# Patient Record
Sex: Female | Born: 1935
Health system: Southern US, Community
[De-identification: ages and names within clinical notes are randomized; demographics above are authoritative.]

## PROBLEM LIST (undated history)

## (undated) DIAGNOSIS — N950 Postmenopausal bleeding: Secondary | ICD-10-CM

## (undated) DIAGNOSIS — I252 Old myocardial infarction: Secondary | ICD-10-CM

## (undated) DIAGNOSIS — R001 Bradycardia, unspecified: Secondary | ICD-10-CM

## (undated) DIAGNOSIS — I10 Essential (primary) hypertension: Secondary | ICD-10-CM

## (undated) DIAGNOSIS — E785 Hyperlipidemia, unspecified: Secondary | ICD-10-CM

## (undated) DIAGNOSIS — N9489 Other specified conditions associated with female genital organs and menstrual cycle: Secondary | ICD-10-CM

## (undated) DIAGNOSIS — I2510 Atherosclerotic heart disease of native coronary artery without angina pectoris: Secondary | ICD-10-CM

## (undated) HISTORY — DX: Essential (primary) hypertension: I10

## (undated) HISTORY — PX: HYSTEROSCOPY: SHX211

## (undated) HISTORY — PX: APPENDECTOMY: SHX54

## (undated) HISTORY — DX: Bradycardia, unspecified: R00.1

## (undated) HISTORY — DX: Atherosclerotic heart disease of native coronary artery without angina pectoris: I25.10

## (undated) HISTORY — PX: CATARACT EXTRACTION: SUR2

## (undated) HISTORY — PX: DILATION AND CURETTAGE OF UTERUS: SHX78

## (undated) HISTORY — DX: Old myocardial infarction: I25.2

## (undated) HISTORY — DX: Postmenopausal bleeding: N95.0

## (undated) HISTORY — DX: Hyperlipidemia, unspecified: E78.5

## (undated) HISTORY — DX: Other specified conditions associated with female genital organs and menstrual cycle: N94.89

---

## 1999-01-19 DIAGNOSIS — I252 Old myocardial infarction: Secondary | ICD-10-CM

## 1999-01-19 HISTORY — DX: Old myocardial infarction: I25.2

## 1999-02-25 ENCOUNTER — Other Ambulatory Visit: Admission: RE | Admit: 1999-02-25 | Discharge: 1999-02-25 | Payer: Self-pay | Admitting: Family Medicine

## 1999-06-26 ENCOUNTER — Encounter: Admission: RE | Admit: 1999-06-26 | Discharge: 1999-06-26 | Payer: Self-pay | Admitting: Family Medicine

## 1999-06-26 ENCOUNTER — Encounter: Payer: Self-pay | Admitting: Family Medicine

## 2000-03-03 ENCOUNTER — Other Ambulatory Visit: Admission: RE | Admit: 2000-03-03 | Discharge: 2000-03-03 | Payer: Self-pay | Admitting: Family Medicine

## 2001-04-11 ENCOUNTER — Encounter: Admission: RE | Admit: 2001-04-11 | Discharge: 2001-04-11 | Payer: Self-pay | Admitting: Family Medicine

## 2001-04-11 ENCOUNTER — Encounter: Payer: Self-pay | Admitting: Family Medicine

## 2001-07-19 ENCOUNTER — Encounter: Payer: Self-pay | Admitting: Family Medicine

## 2001-07-19 ENCOUNTER — Encounter: Admission: RE | Admit: 2001-07-19 | Discharge: 2001-07-19 | Payer: Self-pay | Admitting: Family Medicine

## 2002-01-30 ENCOUNTER — Other Ambulatory Visit: Admission: RE | Admit: 2002-01-30 | Discharge: 2002-01-30 | Payer: Self-pay | Admitting: Family Medicine

## 2002-03-08 ENCOUNTER — Ambulatory Visit (HOSPITAL_COMMUNITY): Admission: RE | Admit: 2002-03-08 | Discharge: 2002-03-08 | Payer: Self-pay

## 2004-06-02 ENCOUNTER — Other Ambulatory Visit: Admission: RE | Admit: 2004-06-02 | Discharge: 2004-06-02 | Payer: Self-pay | Admitting: Obstetrics and Gynecology

## 2005-11-15 ENCOUNTER — Other Ambulatory Visit: Admission: RE | Admit: 2005-11-15 | Discharge: 2005-11-15 | Payer: Self-pay | Admitting: Obstetrics and Gynecology

## 2006-08-17 ENCOUNTER — Encounter: Admission: RE | Admit: 2006-08-17 | Discharge: 2006-08-17 | Payer: Self-pay | Admitting: Obstetrics and Gynecology

## 2006-08-25 ENCOUNTER — Encounter: Admission: RE | Admit: 2006-08-25 | Discharge: 2006-08-25 | Payer: Self-pay | Admitting: Obstetrics and Gynecology

## 2007-02-21 ENCOUNTER — Encounter: Payer: Self-pay | Admitting: Cardiology

## 2007-10-31 ENCOUNTER — Encounter: Admission: RE | Admit: 2007-10-31 | Discharge: 2007-10-31 | Payer: Self-pay | Admitting: Family Medicine

## 2008-05-22 ENCOUNTER — Encounter: Admission: RE | Admit: 2008-05-22 | Discharge: 2008-05-22 | Payer: Self-pay | Admitting: Family Medicine

## 2008-10-29 ENCOUNTER — Ambulatory Visit (HOSPITAL_COMMUNITY): Admission: RE | Admit: 2008-10-29 | Discharge: 2008-10-29 | Payer: Self-pay | Admitting: Obstetrics and Gynecology

## 2008-10-29 ENCOUNTER — Encounter (INDEPENDENT_AMBULATORY_CARE_PROVIDER_SITE_OTHER): Payer: Self-pay | Admitting: Obstetrics and Gynecology

## 2010-02-08 ENCOUNTER — Encounter: Payer: Self-pay | Admitting: Obstetrics and Gynecology

## 2010-02-27 ENCOUNTER — Ambulatory Visit (INDEPENDENT_AMBULATORY_CARE_PROVIDER_SITE_OTHER): Payer: PRIVATE HEALTH INSURANCE | Admitting: Cardiology

## 2010-02-27 DIAGNOSIS — E7800 Pure hypercholesterolemia, unspecified: Secondary | ICD-10-CM

## 2010-02-27 DIAGNOSIS — I252 Old myocardial infarction: Secondary | ICD-10-CM

## 2010-02-27 DIAGNOSIS — I2510 Atherosclerotic heart disease of native coronary artery without angina pectoris: Secondary | ICD-10-CM

## 2010-02-27 DIAGNOSIS — E119 Type 2 diabetes mellitus without complications: Secondary | ICD-10-CM

## 2010-04-23 LAB — TYPE AND SCREEN
ABO/RH(D): B POS
Antibody Screen: NEGATIVE

## 2010-04-23 LAB — COMPREHENSIVE METABOLIC PANEL
ALT: 17 U/L (ref 0–35)
Alkaline Phosphatase: 48 U/L (ref 39–117)
BUN: 17 mg/dL (ref 6–23)
CO2: 25 mEq/L (ref 19–32)
GFR calc non Af Amer: 51 mL/min — ABNORMAL LOW (ref >60)
Glucose, Bld: 114 mg/dL — ABNORMAL HIGH (ref 70–99)
Potassium: 3.7 mEq/L (ref 3.5–5.1)
Sodium: 137 mEq/L (ref 135–145)
Total Bilirubin: 0.9 mg/dL (ref 0.3–1.2)

## 2010-04-23 LAB — CBC
HCT: 34.6 % — ABNORMAL LOW (ref 36.0–46.0)
Hemoglobin: 11.7 g/dL — ABNORMAL LOW (ref 12.0–15.0)
RBC: 3.66 MIL/uL — ABNORMAL LOW (ref 3.87–5.11)
RDW: 12.6 % (ref 11.5–15.5)

## 2010-04-24 LAB — COMPREHENSIVE METABOLIC PANEL
Albumin: 3.9 g/dL (ref 3.5–5.2)
Alkaline Phosphatase: 46 U/L (ref 39–117)
BUN: 22 mg/dL (ref 6–23)
CO2: 28 mEq/L (ref 19–32)
Chloride: 103 mEq/L (ref 96–112)
Creatinine, Ser: 1.08 mg/dL (ref 0.4–1.2)
GFR calc non Af Amer: 49 mL/min — ABNORMAL LOW (ref >60)
Glucose, Bld: 113 mg/dL — ABNORMAL HIGH (ref 70–99)
Potassium: 3.7 mEq/L (ref 3.5–5.1)
Total Bilirubin: 0.8 mg/dL (ref 0.3–1.2)

## 2010-04-24 LAB — ABO/RH: ABO/RH(D): B POS

## 2010-04-24 LAB — CBC
HCT: 35.7 % — ABNORMAL LOW (ref 36.0–46.0)
Hemoglobin: 12.3 g/dL (ref 12.0–15.0)
MCV: 94.2 fL (ref 78.0–100.0)
RBC: 3.79 MIL/uL — ABNORMAL LOW (ref 3.87–5.11)
WBC: 5.8 10*3/uL (ref 4.0–10.5)

## 2010-06-08 ENCOUNTER — Other Ambulatory Visit: Payer: Self-pay | Admitting: Family Medicine

## 2010-06-08 DIAGNOSIS — Z1231 Encounter for screening mammogram for malignant neoplasm of breast: Secondary | ICD-10-CM

## 2010-06-18 ENCOUNTER — Other Ambulatory Visit: Payer: Self-pay | Admitting: Dermatology

## 2010-06-19 ENCOUNTER — Ambulatory Visit
Admission: RE | Admit: 2010-06-19 | Discharge: 2010-06-19 | Disposition: A | Discharge status: Home / Self Care | Payer: Medicare Other | Source: Ambulatory Visit | Attending: Family Medicine | Admitting: Family Medicine

## 2010-06-19 DIAGNOSIS — Z1231 Encounter for screening mammogram for malignant neoplasm of breast: Secondary | ICD-10-CM

## 2010-07-21 ENCOUNTER — Other Ambulatory Visit: Payer: Self-pay | Admitting: *Deleted

## 2010-07-21 MED ORDER — ATORVASTATIN CALCIUM 20 MG PO TABS: 20.0000 mg | ORAL_TABLET | Freq: Every day | ORAL | Status: DC

## 2010-07-21 NOTE — Telephone Encounter (Signed)
escribe medication per fax request  

## 2010-08-10 ENCOUNTER — Other Ambulatory Visit: Payer: Self-pay | Admitting: *Deleted

## 2010-08-10 MED ORDER — LISINOPRIL-HYDROCHLOROTHIAZIDE 20-12.5 MG PO TABS: 1.0000 | ORAL_TABLET | Freq: Every day | ORAL | Status: DC

## 2010-08-10 NOTE — Telephone Encounter (Signed)
escribe medication per fax request  

## 2011-01-29 ENCOUNTER — Other Ambulatory Visit: Payer: Self-pay

## 2011-01-29 MED ORDER — ATORVASTATIN CALCIUM 20 MG PO TABS: 20.0000 mg | ORAL_TABLET | Freq: Every day | ORAL | Status: DC

## 2011-02-16 ENCOUNTER — Other Ambulatory Visit: Payer: Self-pay | Admitting: *Deleted

## 2011-02-16 MED ORDER — LISINOPRIL-HYDROCHLOROTHIAZIDE 20-12.5 MG PO TABS: 1.0000 | ORAL_TABLET | Freq: Every day | ORAL | Status: DC

## 2011-02-26 ENCOUNTER — Other Ambulatory Visit: Payer: Self-pay | Admitting: Cardiology

## 2011-03-10 ENCOUNTER — Ambulatory Visit (INDEPENDENT_AMBULATORY_CARE_PROVIDER_SITE_OTHER): Payer: Medicare Other | Admitting: Cardiology

## 2011-03-10 ENCOUNTER — Encounter: Payer: Self-pay | Admitting: Cardiology

## 2011-03-10 VITALS — BP 134/64 | HR 72 | Wt 108.2 lb

## 2011-03-10 DIAGNOSIS — E119 Type 2 diabetes mellitus without complications: Secondary | ICD-10-CM

## 2011-03-10 DIAGNOSIS — I2510 Atherosclerotic heart disease of native coronary artery without angina pectoris: Secondary | ICD-10-CM

## 2011-03-10 DIAGNOSIS — I252 Old myocardial infarction: Secondary | ICD-10-CM

## 2011-03-10 DIAGNOSIS — I10 Essential (primary) hypertension: Secondary | ICD-10-CM | POA: Insufficient documentation

## 2011-03-10 DIAGNOSIS — E785 Hyperlipidemia, unspecified: Secondary | ICD-10-CM

## 2011-03-10 NOTE — Progress Notes (Signed)
   Cindy Harrison Date of Birth: Jan 09, 1926 Medical Record #[id number]  History of Present Illness: Cindy Harrison is seen for yearly followup. She is an 78 year old white female with known history of coronary disease. She had an anterior myocardial infarction in 2001 initially treated with thrombolytic therapy. She then underwent rotational atherectomy and stenting of a proximal LAD stenosis with a 3.0 x 18 mm Tetra stent. She has done fairly well since then. Her last stress Cardiolite study in February 2009 showed a fixed anterior apical defect without ischemia ejection fraction 64%. She denies any recurrent angina. She's had no shortness of breath or palpitations. She's had no edema or orthopnea.  Current Outpatient Prescriptions on File Prior to Visit  Medication Sig Dispense Refill  . atorvastatin (LIPITOR) 20 MG tablet TAKE ONE (1) TABLET BY MOUTH EVERY      DAY  30 tablet  0  . lisinopril-hydrochlorothiazide (PRINZIDE,ZESTORETIC) 20-12.5 MG per tablet Take 1 tablet by mouth daily.  30 tablet  1    No Known Allergies  Past Medical History  Diagnosis Date  . Postmenopausal bleeding   . Endometrial mass   . Diabetes mellitus   . HTN (hypertension)   . Hyperlipidemia   . CAD (coronary artery disease)   . History of acute anterior wall MI 2001    stent prox LAD 3.0x18 Tetra  . Sinus bradycardia     Past Surgical History  Procedure Date  . Hysteroscopy   . Dilation and curettage of uterus   . Cataract extraction     bilateral  . Appendectomy     History  Smoking status  . Never Smoker   Smokeless tobacco  . Not on file    History  Alcohol Use: Not on file    History reviewed. No pertinent family history.  Review of Systems: The review of systems is positive for osteoporosis. She is starting a new injection for this. She reports that her diabetes has been under good control. All other systems were reviewed and are negative.  Physical Exam: BP 134/64  Pulse 72   Wt 49.079 kg (108 lb 3.2 oz) She is a pleasant elderly white female in no acute distress. She is normocephalic, atraumatic. Pupils are equal round and reactive. Sclera clear. Oropharynx is clear. Neck is supple without JVD, adenopathy, thyromegaly, or bruits. Lungs are clear. Cardiac exam reveals a regular rate and rhythm without gallop, murmur, or click. Abdomen is soft and nontender without masses. She has no edema. The pulses are 2+. She is alert oriented x3. Cranial nerves II through XII are intact. LABORATORY: ECG reveals normal sinus rhythm with a old septal infarct.  Assessment / Plan:

## 2011-03-10 NOTE — Assessment & Plan Note (Signed)
Blood pressure control is satisfactory on her current medications. 

## 2011-03-10 NOTE — Assessment & Plan Note (Signed)
She continues to do very well and is now 12 years out from her myocardial infarction. She is asymptomatic. I have elected not to do any further followup stress testing unless she were to develop symptoms. She will continue with her risk factor modification and I will followup again in one year.

## 2011-03-10 NOTE — Patient Instructions (Signed)
Continue your current medication.  I will see you again in 1 year.  

## 2011-03-31 ENCOUNTER — Other Ambulatory Visit: Payer: Self-pay | Admitting: Cardiology

## 2011-04-13 ENCOUNTER — Other Ambulatory Visit: Payer: Self-pay | Admitting: Dermatology

## 2011-04-19 ENCOUNTER — Other Ambulatory Visit: Payer: Self-pay | Admitting: Cardiology

## 2011-09-27 ENCOUNTER — Encounter: Payer: Self-pay | Admitting: Cardiology

## 2011-11-10 ENCOUNTER — Other Ambulatory Visit: Payer: Self-pay | Admitting: Cardiology

## 2011-11-19 ENCOUNTER — Other Ambulatory Visit: Payer: Self-pay | Admitting: Cardiology

## 2012-01-07 ENCOUNTER — Other Ambulatory Visit: Payer: Self-pay | Admitting: Dermatology

## 2012-03-01 ENCOUNTER — Other Ambulatory Visit: Payer: Self-pay

## 2012-03-01 ENCOUNTER — Encounter: Payer: Self-pay | Admitting: Cardiology

## 2012-03-01 ENCOUNTER — Ambulatory Visit (INDEPENDENT_AMBULATORY_CARE_PROVIDER_SITE_OTHER): Payer: Medicare Other | Admitting: Cardiology

## 2012-03-01 VITALS — BP 126/62 | HR 66 | Ht 60.0 in | Wt 111.4 lb

## 2012-03-01 DIAGNOSIS — I639 Cerebral infarction, unspecified: Secondary | ICD-10-CM

## 2012-03-01 DIAGNOSIS — I10 Essential (primary) hypertension: Secondary | ICD-10-CM

## 2012-03-01 DIAGNOSIS — G459 Transient cerebral ischemic attack, unspecified: Secondary | ICD-10-CM | POA: Insufficient documentation

## 2012-03-01 DIAGNOSIS — E785 Hyperlipidemia, unspecified: Secondary | ICD-10-CM

## 2012-03-01 DIAGNOSIS — I2510 Atherosclerotic heart disease of native coronary artery without angina pectoris: Secondary | ICD-10-CM

## 2012-03-01 DIAGNOSIS — I6350 Cerebral infarction due to unspecified occlusion or stenosis of unspecified cerebral artery: Secondary | ICD-10-CM

## 2012-03-01 LAB — BASIC METABOLIC PANEL
BUN: 22 mg/dL (ref 6–23)
GFR: 52.36 mL/min — ABNORMAL LOW (ref >60.00)
Potassium: 3.9 mEq/L (ref 3.5–5.1)
Sodium: 135 mEq/L (ref 135–145)

## 2012-03-01 NOTE — Progress Notes (Signed)
Cindy Harrison Date of Birth: [birth year] Medical Record #[id number]  History of Present Illness: Cindy Harrison is seen for yearly followup.  She had an anterior myocardial infarction in 2001 initially treated with thrombolytic therapy. She then underwent rotational atherectomy and stenting of a proximal LAD stenosis with a 3.0 x 18 mm Tetra stent.  Her last stress Cardiolite study in February 2009 showed a fixed anterior apical defect without ischemia ejection fraction 64%. She denies any recurrent angina. She's had no shortness of breath or palpitations. She's had no edema or orthopnea. She does describe an episode 10-12 weeks ago where she was watching TV and developed a sudden visual field cut and had jumbled speech. This lasted 20-30 minutes. She did not seek medical attention. She just went to bed and noted that the symptoms were better the next day.  Current Outpatient Prescriptions on File Prior to Visit  Medication Sig Dispense Refill  . aspirin 81 MG tablet Take 81 mg by mouth daily.      Marland Kitchen atorvastatin (LIPITOR) 20 MG tablet TAKE ONE (1) TABLET BY MOUTH EVERY      DAY  30 tablet  6  . Denosumab (PROLIA [place]) Inject into the skin.      Marland Kitchen lisinopril-hydrochlorothiazide (PRINZIDE,ZESTORETIC) 20-12.5 MG per tablet TAKE ONE (1) TABLET BY MOUTH EVERY      DAY  30 tablet  12  . metFORMIN (GLUCOPHAGE) 500 MG tablet Take 500 mg by mouth 3 (three) times daily.       No current facility-administered medications on file prior to visit.    No Known Allergies  Past Medical History  Diagnosis Date  . Postmenopausal bleeding   . Endometrial mass   . Diabetes mellitus   . HTN (hypertension)   . Hyperlipidemia   . CAD (coronary artery disease)   . History of acute anterior wall MI 2001    stent prox LAD 3.0x18 Tetra  . Sinus bradycardia     Past Surgical History  Procedure Laterality Date  . Hysteroscopy    . Dilation and curettage of uterus    . Cataract extraction      bilateral  .  Appendectomy      History  Smoking status  . Never Smoker   Smokeless tobacco  . Not on file    History  Alcohol Use: Not on file    History reviewed. No pertinent family history.  Review of Systems: The review of systems is positive recent excision of a skin cancer on her scalp. She reports that her diabetes has been under good control. All other systems were reviewed and are negative.  Physical Exam: BP 126/62  Pulse 66  Ht 5' (1.524 m)  Wt 111 lb 6.4 oz (50.531 kg)  BMI 21.76 kg/m2 She is a pleasant elderly white female in no acute distress. She is normocephalic, atraumatic. Pupils are equal round and reactive. Sclera clear. Oropharynx is clear. Neck is supple without JVD, adenopathy, thyromegaly, or bruits. Lungs are clear. Cardiac exam reveals a regular rate and rhythm without gallop, murmur, or click. Abdomen is soft and nontender without masses. She has no edema. The pulses are 2+. She is alert oriented x3. Cranial nerves II through XII are intact. Normal motor and sensory exam. Gait is normal.  LABORATORY: ECG reveals normal sinus rhythm with a old septal infarct.  Assessment / Plan: 1. Coronary disease status post remote anterior myocardial infarction with stenting of the LAD. She is asymptomatic. I would not recommend  routine followup stress testing at this point given her advanced age.  2. CVA. Her symptoms previously are fairly classic for CVA. Fortunately her symptoms resolved. We will evaluate further with carotid Doppler studies and an echocardiogram. I've also schedule her for a cranial MRI/MRA. I've increased her aspirin to 325 mg daily. I told her to seek immediate medical attention if her symptoms recur.  3. Hyperlipidemia.  4. Diabetes mellitus type 2.  5. Hypertension, controlled.

## 2012-03-01 NOTE — Patient Instructions (Addendum)
We will schedule you for an echocardiogram and carotid dopplers for next week.  We will also schedule you for a cranial MRI for next week  Increase ASA to 324 mg daily  Continue your other therapy.

## 2012-03-06 ENCOUNTER — Encounter (INDEPENDENT_AMBULATORY_CARE_PROVIDER_SITE_OTHER): Payer: Medicare Other

## 2012-03-06 ENCOUNTER — Ambulatory Visit
Admission: RE | Admit: 2012-03-06 | Discharge: 2012-03-06 | Disposition: A | Discharge status: Home / Self Care | Payer: Medicare Other | Source: Ambulatory Visit | Attending: Cardiology | Admitting: Cardiology

## 2012-03-06 ENCOUNTER — Other Ambulatory Visit: Payer: Medicare Other

## 2012-03-06 DIAGNOSIS — I10 Essential (primary) hypertension: Secondary | ICD-10-CM

## 2012-03-06 DIAGNOSIS — I2510 Atherosclerotic heart disease of native coronary artery without angina pectoris: Secondary | ICD-10-CM

## 2012-03-06 DIAGNOSIS — I639 Cerebral infarction, unspecified: Secondary | ICD-10-CM

## 2012-03-06 DIAGNOSIS — I6529 Occlusion and stenosis of unspecified carotid artery: Secondary | ICD-10-CM

## 2012-03-06 DIAGNOSIS — E785 Hyperlipidemia, unspecified: Secondary | ICD-10-CM

## 2012-03-06 DIAGNOSIS — H53129 Transient visual loss, unspecified eye: Secondary | ICD-10-CM

## 2012-03-06 DIAGNOSIS — R4701 Aphasia: Secondary | ICD-10-CM

## 2012-03-07 ENCOUNTER — Other Ambulatory Visit: Payer: Self-pay | Admitting: Cardiology

## 2012-03-08 ENCOUNTER — Ambulatory Visit (HOSPITAL_COMMUNITY): Payer: Medicare Other | Attending: Cardiology | Admitting: Radiology

## 2012-03-08 DIAGNOSIS — E119 Type 2 diabetes mellitus without complications: Secondary | ICD-10-CM | POA: Insufficient documentation

## 2012-03-08 DIAGNOSIS — I10 Essential (primary) hypertension: Secondary | ICD-10-CM

## 2012-03-08 DIAGNOSIS — I639 Cerebral infarction, unspecified: Secondary | ICD-10-CM

## 2012-03-08 DIAGNOSIS — I6789 Other cerebrovascular disease: Secondary | ICD-10-CM

## 2012-03-08 DIAGNOSIS — I2510 Atherosclerotic heart disease of native coronary artery without angina pectoris: Secondary | ICD-10-CM

## 2012-03-08 DIAGNOSIS — I059 Rheumatic mitral valve disease, unspecified: Secondary | ICD-10-CM | POA: Insufficient documentation

## 2012-03-08 DIAGNOSIS — E785 Hyperlipidemia, unspecified: Secondary | ICD-10-CM | POA: Insufficient documentation

## 2012-03-08 NOTE — Progress Notes (Signed)
Echocardiogram performed.  

## 2012-03-09 ENCOUNTER — Telehealth: Payer: Self-pay | Admitting: Cardiology

## 2012-03-09 ENCOUNTER — Other Ambulatory Visit: Payer: Self-pay

## 2012-03-09 DIAGNOSIS — I6529 Occlusion and stenosis of unspecified carotid artery: Secondary | ICD-10-CM

## 2012-03-09 DIAGNOSIS — I2510 Atherosclerotic heart disease of native coronary artery without angina pectoris: Secondary | ICD-10-CM

## 2012-03-09 NOTE — Telephone Encounter (Signed)
Patient called results from carotid dopplers,echo,mri given.Schedulers will be calling to schedule myoview.Advised to follow up with PCP reguarding rt thyroid cyst.Appointment scheduled with Dr.Jordan 04/12/12.

## 2012-03-09 NOTE — Telephone Encounter (Signed)
New Problem:    Patient called in wanting to know the results of her recent test.  Please call back.

## 2012-03-16 ENCOUNTER — Encounter (HOSPITAL_COMMUNITY): Payer: Medicare Other

## 2012-03-23 ENCOUNTER — Encounter (HOSPITAL_COMMUNITY): Payer: Medicare Other

## 2012-03-27 ENCOUNTER — Other Ambulatory Visit: Payer: Self-pay | Admitting: Family Medicine

## 2012-03-27 ENCOUNTER — Encounter: Payer: Self-pay | Admitting: Cardiology

## 2012-03-27 ENCOUNTER — Ambulatory Visit
Admission: RE | Admit: 2012-03-27 | Discharge: 2012-03-27 | Disposition: A | Discharge status: Home / Self Care | Payer: Medicare Other | Source: Ambulatory Visit | Attending: Family Medicine | Admitting: Family Medicine

## 2012-03-27 DIAGNOSIS — E041 Nontoxic single thyroid nodule: Secondary | ICD-10-CM

## 2012-03-28 ENCOUNTER — Ambulatory Visit (HOSPITAL_COMMUNITY): Payer: Medicare Other | Attending: Cardiology | Admitting: Radiology

## 2012-03-28 VITALS — BP 126/54 | HR 58 | Ht 60.0 in | Wt 108.0 lb

## 2012-03-28 DIAGNOSIS — I2510 Atherosclerotic heart disease of native coronary artery without angina pectoris: Secondary | ICD-10-CM | POA: Insufficient documentation

## 2012-03-28 MED ORDER — REGADENOSON 0.4 MG/5ML IV SOLN
0.4000 mg | Freq: Once | INTRAVENOUS | Status: AC
  Administered 2012-03-28: 0.4 mg via INTRAVENOUS

## 2012-03-28 MED ORDER — TECHNETIUM TC 99M SESTAMIBI GENERIC - CARDIOLITE
10.0000 | Freq: Once | INTRAVENOUS | Status: AC | PRN
  Administered 2012-03-28: 10 via INTRAVENOUS

## 2012-03-28 MED ORDER — TECHNETIUM TC 99M SESTAMIBI GENERIC - CARDIOLITE
30.0000 | Freq: Once | INTRAVENOUS | Status: AC | PRN
  Administered 2012-03-28: 30 via INTRAVENOUS

## 2012-03-28 NOTE — Progress Notes (Signed)
[facility] SITE 3 NUCLEAR MED [address] [place], [place] [address] [phone]    Cardiology Nuclear Med Study  Cindy Harrison is a 79 y.o. female     MRN : [id number]     DOB: [birth year]  Procedure Date: 03/28/2012  Nuclear Med Background Indication for Stress Test:  Evaluation for Ischemia and HSRA/Stent Patency History:  '01 AWMI>HSRA/Stent-LAD; '09 HQI:ONGEX antero-apical defect, consistent prior MI, no ischemia, EF=64% Cardiac Risk Factors: Carotid Disease, CVA, Hypertension, Lipids and NIDDM  Symptoms:  No definite cardiac symptoms, c/o blurry vision and slurred speech; CVA r/o.   Nuclear Pre-Procedure Caffeine/Decaff Intake:  None NPO After: 6 pm   Lungs:  Clear. O2 Sat: 97% on room air. IV 0.9% NS with Angio Cath:  20g  IV Site: L Antecubital  IV Started by:  Milana Na, EMT-P  Chest Size (in):  36 Cup Size: A  Height: 5' (1.524 m)  Weight:  108 lb (48.988 kg)  BMI:  Body mass index is 21.09 kg/(m^2). Tech Comments:  No Prinzide in 48 hrs    Nuclear Med Study 1 or 2 day study: 1 day  Stress Test Type:  Lexiscan  Reading MD: Willa Rough, MD  Order Authorizing Provider:  Peter Swaz[place], MD  Resting Radionuclide: Technetium 35m Sestamibi  Resting Radionuclide Dose: 10.7 mCi   Stress Radionuclide:  Technetium 61m Sestamibi  Stress Radionuclide Dose: 32.9 mCi           Stress Protocol Rest HR: 58 Stress HR: 86  Rest BP: 126/54 Stress BP: 136/55  Exercise Time (min): n/a METS: n/a   Predicted Max HR: 135 bpm % Max HR: 63.7 bpm Rate Pressure Product: 52841   Dose of Adenosine (mg):  n/a Dose of Lexiscan: 0.4 mg  Dose of Atropine (mg): n/a Dose of Dobutamine: n/a mcg/kg/min (at max HR)  Stress Test Technologist: Smiley Houseman, CMA-N  Nuclear Technologist:  Domenic Polite, CNMT     Rest Procedure:  Myocardial perfusion imaging was performed at rest 45 minutes following the intravenous administration of Technetium 91m Sestamibi.  Rest ECG:  Sinus bradycardia.  Stress Procedure:  The patient received IV Lexiscan 0.4 mg over 15-seconds.  Technetium 36m Sestamibi injected at 30-seconds.  Quantitative spect images were obtained after a 45 minute delay.  Stress ECG: No significant change from baseline ECG  QPS Raw Data Images:  Normal; no motion artifact; normal heart/lung ratio. Stress Images:  There is a small to moderate area of mild to moderate photon reduction in the following segments: apical cap, mid and apical anterior wall, mid and apical anteroseptal wall. Rest Images:  The rest images appear the same as the stress images. Subtraction (SDS):  There may be very slight reversibility. Transient Ischemic Dilatation (Normal <1.22):  1.08 Lung/Heart Ratio (Normal <0.45):  0.34  Quantitative Gated Spect Images QGS EDV:  59 ml QGS ESV:  23 ml  Impression Exercise Capacity:  Lexiscan with no exercise. BP Response:  Normal blood pressure response. Clinical Symptoms:  patient felt weird ECG Impression:  No significant ST segment change suggestive of ischemia. Comparison with Prior Nuclear Study: No images to compare  Overall Impression:  Abnormal nuclear scan. There is evidence of a small to moderate scar in the area of the mid and apical anterior wall and anterior septum. This is consistent with the prior history. There may be slight ischemia. There does not appear to be any significant change from the prior history.  LV Ejection Fraction: 62%.  LV Wall Motion:  There is some hypokinesis of the septum.  Willa Rough, MD

## 2012-04-12 ENCOUNTER — Ambulatory Visit: Payer: Medicare Other | Admitting: Cardiology

## 2012-05-05 ENCOUNTER — Other Ambulatory Visit: Payer: Self-pay

## 2012-05-05 DIAGNOSIS — Z1231 Encounter for screening mammogram for malignant neoplasm of breast: Secondary | ICD-10-CM

## 2012-05-31 ENCOUNTER — Ambulatory Visit (INDEPENDENT_AMBULATORY_CARE_PROVIDER_SITE_OTHER): Payer: Medicare Other | Admitting: Cardiology

## 2012-05-31 ENCOUNTER — Encounter: Payer: Self-pay | Admitting: Cardiology

## 2012-05-31 VITALS — BP 144/68 | HR 68 | Ht 60.0 in | Wt 108.8 lb

## 2012-05-31 DIAGNOSIS — I10 Essential (primary) hypertension: Secondary | ICD-10-CM

## 2012-05-31 DIAGNOSIS — I252 Old myocardial infarction: Secondary | ICD-10-CM

## 2012-05-31 DIAGNOSIS — E785 Hyperlipidemia, unspecified: Secondary | ICD-10-CM

## 2012-05-31 NOTE — Patient Instructions (Signed)
Continue your current therapy  I will get a copy of your lab work from Dr. Clovis Riley.  I will see you in 6 months.

## 2012-06-01 ENCOUNTER — Ambulatory Visit: Payer: Medicare Other

## 2012-06-04 NOTE — Progress Notes (Signed)
Cindy Harrison Date of Birth: Aug 17, 1925 Medical Record #[id number]  History of Present Illness: Cindy Harrison is seen for yearly followup.  She had an anterior myocardial infarction in 2001 initially treated with thrombolytic therapy. She then underwent rotational atherectomy and stenting of a proximal LAD stenosis with a 3.0 x 18 mm Tetra stent.  Recent Myoview study in  showed a fixed anterior apical defect without ischemia ejection fraction 62%. She denies any recurrent angina. She's had no shortness of breath or palpitations. She's had no edema or orthopnea. She reports that she had lab work last month and that her sugar was elevated. Her metformin dose was increased. Her cholesterol was apparently okay.  Current Outpatient Prescriptions on File Prior to Visit  Medication Sig Dispense Refill  . aspirin 81 MG tablet Take 325 mg by mouth daily.       Marland Kitchen atorvastatin (LIPITOR) 20 MG tablet TAKE ONE (1) TABLET BY MOUTH EVERY      DAY  30 tablet  6  . Denosumab (PROLIA [place]) Inject into the skin.      Marland Kitchen lisinopril-hydrochlorothiazide (PRINZIDE,ZESTORETIC) 20-12.5 MG per tablet TAKE ONE (1) TABLET BY MOUTH EVERY      DAY  30 tablet  12  . metFORMIN (GLUCOPHAGE) 500 MG tablet Take 500 mg by mouth 4 (four) times daily.       . nitroGLYCERIN (NITROSTAT) 0.4 MG SL tablet DISSOLVE 1 TABLET UNDER THE TONGUE AS NEEDED FOR CHEST PAIN.  15 tablet  1   No current facility-administered medications on file prior to visit.    No Known Allergies  Past Medical History  Diagnosis Date  . Postmenopausal bleeding   . Endometrial mass   . Diabetes mellitus   . HTN (hypertension)   . Hyperlipidemia   . CAD (coronary artery disease)   . History of acute anterior wall MI 2001    stent prox LAD 3.0x18 Tetra  . Sinus bradycardia     Past Surgical History  Procedure Laterality Date  . Hysteroscopy    . Dilation and curettage of uterus    . Cataract extraction      bilateral  . Appendectomy       History  Smoking status  . Never Smoker   Smokeless tobacco  . Not on file    History  Alcohol Use: Not on file    History reviewed. No pertinent family history.  Review of Systems: As noted in history of present illness. All other systems were reviewed and are negative.  Physical Exam: BP 144/68  Pulse 68  Ht 5' (1.524 m)  Wt 108 lb 12.8 oz (49.351 kg)  BMI 21.25 kg/m2  SpO2 95% She is a pleasant elderly white female in no acute distress. HEENT exam is unremarkable. Neck is supple without JVD, adenopathy, thyromegaly, or bruits. Lungs are clear. Cardiac exam reveals a regular rate and rhythm without gallop, murmur, or click. Abdomen is soft and nontender without masses. She has no edema. The pulses are 2+. She is alert oriented x3. Cranial nerves II through XII are intact. Normal motor and sensory exam. Gait is normal.  LABORATORY: Lab Results  Component Value Date   WBC 5.4 10/28/2008   HGB 11.7* 10/28/2008   HCT 34.6* 10/28/2008   PLT 180 10/28/2008   GLUCOSE 119* 03/01/2012   ALT 17 10/28/2008   AST 25 10/28/2008   NA 135 03/01/2012   K 3.9 03/01/2012   CL 96 03/01/2012   CREATININE 1.1 03/01/2012  BUN 22 03/01/2012   CO2 30 03/01/2012     Assessment / Plan: 1. Coronary disease status post remote anterior myocardial infarction with stenting of the LAD. She is asymptomatic. recent Myoview study showed a small apical scar without ischemia. Normal EF. We'll continue with aspirin, statin, and ACE inhibitor.  2.  TIA. Prior carotid Doppler showed 40-59% right ICA stenosis. Recommend repeat in one year. Echocardiogram showed reduction in her ejection fraction to 4045% but this was not confirmed on Myoview study at which time it was 62%. Cranial MRI/MRA was unremarkable.  3. Hyperlipidemia.  4. Diabetes mellitus type 2.  5. Hypertension, controlled.

## 2012-06-07 ENCOUNTER — Ambulatory Visit
Admission: RE | Admit: 2012-06-07 | Discharge: 2012-06-07 | Disposition: A | Discharge status: Home / Self Care | Payer: Medicare Other | Source: Ambulatory Visit

## 2012-06-07 DIAGNOSIS — Z1231 Encounter for screening mammogram for malignant neoplasm of breast: Secondary | ICD-10-CM

## 2012-06-08 ENCOUNTER — Other Ambulatory Visit: Payer: Self-pay | Admitting: Cardiology

## 2012-06-08 NOTE — Telephone Encounter (Signed)
atorvastatin (LIPITOR) 20 MG tablet  TAKE ONE (1) TABLET BY MOUTH EVERY      DAY   30 tablet   6   1. Coronary disease status post remote anterior myocardial infarction with stenting of the LAD. She is asymptomatic. recent Myoview study showed a small apical scar without ischemia. Normal EF. We'll continue with aspirin, statin, and ACE inhibitor. Patient Instructions  Continue your current therapy  I will get a copy of your lab work from Dr. Clovis Riley.  I will see you in 6 months. Chart Reviewed By  Charna Elizabeth, LPN  on 1/61/0960  9:03 AM     Previous Visit  Provider Department Encounter #  04/12/2012  9:15 AM Peter Swazil[place], MD [facility] [id number]

## 2012-11-04 ENCOUNTER — Ambulatory Visit (INDEPENDENT_AMBULATORY_CARE_PROVIDER_SITE_OTHER): Payer: Medicare Other | Admitting: Family Medicine

## 2012-11-04 VITALS — BP 142/78 | HR 75 | Temp 98.2°F | Resp 16 | Ht 61.0 in | Wt 111.0 lb

## 2012-11-04 DIAGNOSIS — H1130 Conjunctival hemorrhage, unspecified eye: Secondary | ICD-10-CM

## 2012-11-04 DIAGNOSIS — H1132 Conjunctival hemorrhage, left eye: Secondary | ICD-10-CM

## 2012-11-04 DIAGNOSIS — I2510 Atherosclerotic heart disease of native coronary artery without angina pectoris: Secondary | ICD-10-CM

## 2012-11-04 DIAGNOSIS — E119 Type 2 diabetes mellitus without complications: Secondary | ICD-10-CM

## 2012-11-04 NOTE — Progress Notes (Signed)
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 She was up this morning with her left eye being red. She has no pain and does not recall any trauma to that eye. Her vision is normal.  Objective: No acute distress Examination eyes revealed normal fundi, subconjunctival hemorrhage on the left, pupils equal and reactive to light and accommodation, extraocular motion normal There is some mild ecchymosis in the lower left lid.  Assessment: Subconjunctival hemorrhage probably secondary to mild trauma in the middle of the night and the effects of aspirin.  Plan:: Hold the aspirin for one week. Patient reassured Subconjunctival bleed, left  ASCVD (arteriosclerotic cardiovascular disease)  Type 2 diabetes mellitus  Signed, Elvina Sidle, MD

## 2012-11-04 NOTE — Patient Instructions (Signed)
Subconjunctival Hemorrhage °A subconjunctival hemorrhage is a bright red patch covering a portion of the white of the eye. The white part of the eye is called the sclera, and it is covered by a thin membrane called the conjunctiva. This membrane is clear, except for tiny blood vessels that you can see with the naked eye. When your eye is irritated or inflamed and becomes red, it is because the vessels in the conjunctiva are swollen. °Sometimes, a blood vessel in the conjunctiva can break and bleed. When this occurs, the blood builds up between the conjunctiva and the sclera, and spreads out to create a red area. The red spot may be very small at first. It may then spread to cover a larger part of the surface of the eye, or even all of the visible white part of the eye. °In almost all cases, the blood will go away and the eye will become white again. Before completely dissolving, however, the red area may spread. It may also become brownish-yellow in color, before going away. If a lot of blood collects under the conjunctiva, it may look like a bulge on the surface of the eye. This looks scary, but it will also eventually flatten out and go away. Subconjunctival hemorrhages do not cause pain, but if swollen, may cause a feeling of irritation. There is no effect on vision.  °CAUSES  °· The most common cause is mild trauma (rubbing the eye, irritation). °· Subconjunctival hemorrhages can happen because of coughing or straining (lifting heavy objects), vomiting, or sneezing. °· In some cases, your doctor may want to check your blood pressure. High blood pressure can also cause a sunconjunctival hemorrhage. °· Severe trauma or blunt injuries. °· Diseases that affect blood clotting (hemophilia, leukemia). °· Abnormalities of blood vessels behind the eye (carotid cavernous sinus fistula). °· Tumors behind the eye. °· Certain drugs (aspirin, coumadin, heparin). °· Recent eye surgery. °HOME CARE INSTRUCTIONS  °· Do not worry  about the appearance of your eye. You may continue your usual activities. °· Often, follow-up is not necessary. °SEEK MEDICAL CARE IF:  °· Your eye becomes painful. °· The bleeding does not disappear within 3 weeks. °· Bleeding occurs elsewhere, for example, under the skin, in the mouth, or in the other eye. °· You have recurring subconjunctival hemorrhages. °SEEK IMMEDIATE MEDICAL CARE IF:  °· Your vision changes or you have difficulty seeing. °· You develop severe headache, persistent vomiting, confusion, or abnormal drowsiness (lethargy). °· Your eye seems to bulge or protrude from the eye socket. °· You notice the sudden appearance of bruises, or have spontaneous bleeding elsewhere on your body. °Document Released: 01/04/2005 Document Revised: 03/29/2011 Document Reviewed: 12/02/2008 °ExitCare® Patient Information ©2014 [employer]. ° °

## 2012-12-04 ENCOUNTER — Other Ambulatory Visit: Payer: Self-pay | Admitting: Cardiology

## 2012-12-21 ENCOUNTER — Ambulatory Visit: Payer: Medicare Other | Admitting: Cardiology

## 2013-01-05 ENCOUNTER — Other Ambulatory Visit: Payer: Self-pay | Admitting: Cardiology

## 2013-02-05 ENCOUNTER — Other Ambulatory Visit: Payer: Self-pay | Admitting: Cardiology

## 2013-02-14 ENCOUNTER — Encounter: Payer: Self-pay | Admitting: Cardiology

## 2013-02-14 ENCOUNTER — Ambulatory Visit (INDEPENDENT_AMBULATORY_CARE_PROVIDER_SITE_OTHER): Payer: Medicare Other | Admitting: Cardiology

## 2013-02-14 VITALS — BP 140/64 | HR 77 | Ht 61.0 in | Wt 107.1 lb

## 2013-02-14 DIAGNOSIS — I252 Old myocardial infarction: Secondary | ICD-10-CM

## 2013-02-14 DIAGNOSIS — I2510 Atherosclerotic heart disease of native coronary artery without angina pectoris: Secondary | ICD-10-CM

## 2013-02-14 DIAGNOSIS — E785 Hyperlipidemia, unspecified: Secondary | ICD-10-CM

## 2013-02-14 DIAGNOSIS — I10 Essential (primary) hypertension: Secondary | ICD-10-CM

## 2013-02-14 NOTE — Progress Notes (Signed)
Cindy Harrison Date of Birth: [birth year] Medical Record #[id number]  History of Present Illness: Cindy Harrison is seen for yearly followup.  She had an anterior myocardial infarction in 2001 initially treated with thrombolytic therapy. She then underwent rotational atherectomy and stenting of a proximal LAD stenosis with a 3.0 x 18 mm Tetra stent.   Myoview study in March 2014 showed a fixed anterior apical defect without ischemia ejection fraction 62%. She denies any recurrent angina. She's had no shortness of breath or palpitations. She's had no edema or orthopnea. She does have moderate carotid arterial disease. No symptoms of TIA/CVA.  Current Outpatient Prescriptions on File Prior to Visit  Medication Sig Dispense Refill  . aspirin 81 MG tablet Take 325 mg by mouth daily.       Marland Kitchen atorvastatin (LIPITOR) 20 MG tablet TAKE ONE (1) TABLET BY MOUTH EVERY DAY  90 tablet  3  . Denosumab (PROLIA [place]) Inject into the skin.      . fluocinonide cream (LIDEX) 0.05 %       . lisinopril-hydrochlorothiazide (PRINZIDE,ZESTORETIC) 20-12.5 MG per tablet TAKE ONE (1) TABLET BY MOUTH EVERY DAY  30 tablet  0  . metFORMIN (GLUCOPHAGE) 500 MG tablet Take 500 mg by mouth 4 (four) times daily.       . nitroGLYCERIN (NITROSTAT) 0.4 MG SL tablet DISSOLVE 1 TABLET UNDER THE TONGUE AS NEEDED FOR CHEST PAIN.  15 tablet  1   No current facility-administered medications on file prior to visit.    No Known Allergies  Past Medical History  Diagnosis Date  . Postmenopausal bleeding   . Endometrial mass   . Diabetes mellitus   . HTN (hypertension)   . Hyperlipidemia   . CAD (coronary artery disease)   . History of acute anterior wall MI 2001    stent prox LAD 3.0x18 Tetra  . Sinus bradycardia     Past Surgical History  Procedure Laterality Date  . Hysteroscopy    . Dilation and curettage of uterus    . Cataract extraction      bilateral  . Appendectomy      History  Smoking status  . Never Smoker    Smokeless tobacco  . Not on file    History  Alcohol Use No    History reviewed. No pertinent family history.  Review of Systems: As noted in history of present illness. All other systems were reviewed and are negative.  Physical Exam: BP 140/64  Pulse 77  Ht 5\' 1"  (1.549 m)  Wt 107 lb 1.9 oz (48.589 kg)  BMI 20.25 kg/m2  SpO2 95% She is a pleasant elderly white female in no acute distress. HEENT exam is unremarkable. Neck is supple without JVD, adenopathy, thyromegaly, or bruits. Lungs are clear. Cardiac exam reveals a regular rate and rhythm without gallop, murmur, or click. Abdomen is soft and nontender without masses. She has no edema. The pulses are 2+. She is alert oriented x3. Cranial nerves II through XII are intact. Normal motor and sensory exam. Gait is normal.  LABORATORY: Lab Results  Component Value Date   WBC 5.4 10/28/2008   HGB 11.7* 10/28/2008   HCT 34.6* 10/28/2008   PLT 180 10/28/2008   GLUCOSE 119* 03/01/2012   ALT 17 10/28/2008   AST 25 10/28/2008   NA 135 03/01/2012   K 3.9 03/01/2012   CL 96 03/01/2012   CREATININE 1.1 03/01/2012   BUN 22 03/01/2012   CO2 30 03/01/2012  Assessment / Plan: 1. Coronary disease status post remote anterior myocardial infarction with stenting of the LAD. She is asymptomatic.Myoview study 3/14 showed a small apical scar without ischemia. Normal EF. We'll continue with aspirin, statin, and ACE inhibitor. Follow up in one year.  2.  TIA. Prior carotid Doppler showed 40-59% right ICA stenosis. Recommend repeat now.   3. Hyperlipidemia.  4. Diabetes mellitus type 2.  5. Hypertension, controlled.

## 2013-02-14 NOTE — Patient Instructions (Signed)
Continue your current therapy  We will schedule you for carotid dopplers.  I will see you in one year.

## 2013-02-19 ENCOUNTER — Encounter: Payer: Self-pay | Admitting: Cardiovascular Disease

## 2013-02-19 ENCOUNTER — Ambulatory Visit (HOSPITAL_COMMUNITY): Payer: Medicare Other | Attending: Cardiovascular Disease

## 2013-02-19 DIAGNOSIS — I6529 Occlusion and stenosis of unspecified carotid artery: Secondary | ICD-10-CM

## 2013-02-19 DIAGNOSIS — I10 Essential (primary) hypertension: Secondary | ICD-10-CM

## 2013-02-19 DIAGNOSIS — I2510 Atherosclerotic heart disease of native coronary artery without angina pectoris: Secondary | ICD-10-CM

## 2013-02-19 DIAGNOSIS — E119 Type 2 diabetes mellitus without complications: Secondary | ICD-10-CM | POA: Insufficient documentation

## 2013-02-19 DIAGNOSIS — I658 Occlusion and stenosis of other precerebral arteries: Secondary | ICD-10-CM | POA: Insufficient documentation

## 2013-02-19 DIAGNOSIS — I252 Old myocardial infarction: Secondary | ICD-10-CM

## 2013-02-19 DIAGNOSIS — E785 Hyperlipidemia, unspecified: Secondary | ICD-10-CM

## 2013-03-07 ENCOUNTER — Other Ambulatory Visit: Payer: Self-pay | Admitting: Cardiology

## 2013-03-14 ENCOUNTER — Ambulatory Visit (HOSPITAL_COMMUNITY)
Admission: RE | Admit: 2013-03-14 | Discharge: 2013-03-14 | Disposition: A | Discharge status: Home / Self Care | Payer: Medicare Other | Source: Ambulatory Visit | Attending: Family Medicine | Admitting: Family Medicine

## 2013-03-14 ENCOUNTER — Other Ambulatory Visit (HOSPITAL_COMMUNITY): Payer: Self-pay | Admitting: Family Medicine

## 2013-03-14 DIAGNOSIS — M79609 Pain in unspecified limb: Secondary | ICD-10-CM

## 2013-03-14 DIAGNOSIS — M7989 Other specified soft tissue disorders: Secondary | ICD-10-CM | POA: Insufficient documentation

## 2013-03-14 NOTE — Progress Notes (Signed)
Right lower extremity venous duplex completed.  Right:  No evidence of DVT, superficial thrombosis, or Baker's cyst.  Left:  Negative for DVT in the common femoral vein.  

## 2013-04-06 ENCOUNTER — Other Ambulatory Visit: Payer: Self-pay | Admitting: Cardiology

## 2013-04-09 ENCOUNTER — Other Ambulatory Visit: Payer: Self-pay | Admitting: Family Medicine

## 2013-04-09 ENCOUNTER — Ambulatory Visit
Admission: RE | Admit: 2013-04-09 | Discharge: 2013-04-09 | Disposition: A | Discharge status: Home / Self Care | Payer: Medicare Other | Source: Ambulatory Visit | Attending: Family Medicine | Admitting: Family Medicine

## 2013-04-09 DIAGNOSIS — M79609 Pain in unspecified limb: Secondary | ICD-10-CM

## 2013-10-08 ENCOUNTER — Encounter: Payer: Self-pay | Admitting: Cardiology

## 2013-10-09 ENCOUNTER — Other Ambulatory Visit (HOSPITAL_COMMUNITY): Payer: Self-pay | Admitting: Family Medicine

## 2013-10-09 DIAGNOSIS — R0989 Other specified symptoms and signs involving the circulatory and respiratory systems: Secondary | ICD-10-CM

## 2013-10-15 ENCOUNTER — Ambulatory Visit (HOSPITAL_COMMUNITY)
Admission: RE | Admit: 2013-10-15 | Discharge: 2013-10-15 | Disposition: A | Discharge status: Home / Self Care | Payer: Medicare Other | Source: Ambulatory Visit | Attending: Internal Medicine | Admitting: Internal Medicine

## 2013-10-15 DIAGNOSIS — I6529 Occlusion and stenosis of unspecified carotid artery: Secondary | ICD-10-CM | POA: Insufficient documentation

## 2013-10-15 DIAGNOSIS — R0989 Other specified symptoms and signs involving the circulatory and respiratory systems: Secondary | ICD-10-CM | POA: Diagnosis not present

## 2013-10-15 NOTE — Progress Notes (Signed)
Carotid Duplex Completed. Cindy Harrison, BS, RDMS, RVT  

## 2013-10-18 ENCOUNTER — Telehealth (HOSPITAL_COMMUNITY): Payer: Self-pay | Admitting: *Deleted

## 2014-01-02 ENCOUNTER — Other Ambulatory Visit: Payer: Self-pay | Admitting: Cardiology

## 2014-02-04 ENCOUNTER — Other Ambulatory Visit: Payer: Self-pay | Admitting: Cardiology

## 2014-02-26 ENCOUNTER — Telehealth: Payer: Self-pay | Admitting: Cardiology

## 2014-02-27 ENCOUNTER — Ambulatory Visit: Payer: Medicare Other | Admitting: Cardiology

## 2014-02-27 NOTE — Telephone Encounter (Signed)
Closed encounter °

## 2014-03-11 ENCOUNTER — Other Ambulatory Visit: Payer: Self-pay | Admitting: Cardiology

## 2014-04-16 ENCOUNTER — Other Ambulatory Visit: Payer: Self-pay | Admitting: Cardiology

## 2014-05-21 ENCOUNTER — Other Ambulatory Visit: Payer: Self-pay | Admitting: Cardiology

## 2014-05-28 ENCOUNTER — Encounter: Payer: Self-pay | Admitting: Cardiology

## 2014-05-28 ENCOUNTER — Ambulatory Visit (INDEPENDENT_AMBULATORY_CARE_PROVIDER_SITE_OTHER): Payer: Medicare Other | Admitting: Cardiology

## 2014-05-28 VITALS — BP 148/60 | HR 72 | Ht 60.0 in | Wt 104.9 lb

## 2014-05-28 DIAGNOSIS — E785 Hyperlipidemia, unspecified: Secondary | ICD-10-CM | POA: Diagnosis not present

## 2014-05-28 DIAGNOSIS — I2510 Atherosclerotic heart disease of native coronary artery without angina pectoris: Secondary | ICD-10-CM | POA: Diagnosis not present

## 2014-05-28 DIAGNOSIS — I10 Essential (primary) hypertension: Secondary | ICD-10-CM

## 2014-05-28 NOTE — Progress Notes (Signed)
Cindy Harrison Date of Birth: 1926-01-06 Medical Record #[id number]  History of Present Illness: Cindy Harrison is seen for follow up CAD.  She had an anterior myocardial infarction in 2001 initially treated with thrombolytic therapy. She then underwent rotational atherectomy and stenting of a proximal LAD stenosis with a 3.0 x 18 mm Tetra stent.   Myoview study in March 2014 showed a fixed anterior apical defect without ischemia ejection fraction 62%. She denies any recurrent angina. She's had no shortness of breath or palpitations. She overall feels well. She does have moderate carotid arterial disease. No symptoms of TIA/CVA. She reports that her brother recently had a large MI with arrest but has survived.   Current Outpatient Prescriptions on File Prior to Visit  Medication Sig Dispense Refill  . aspirin 81 MG tablet Take 325 mg by mouth daily.     . Denosumab (PROLIA [place]) Inject into the skin.    . fluocinonide cream (LIDEX) 0.05 %     . lisinopril-hydrochlorothiazide (PRINZIDE,ZESTORETIC) 20-12.5 MG per tablet TAKE ONE (1) TABLET BY MOUTH EVERY DAY 30 tablet 0  . metFORMIN (GLUCOPHAGE) 500 MG tablet Take 500 mg by mouth 4 (four) times daily.     . nitroGLYCERIN (NITROSTAT) 0.4 MG SL tablet DISSOLVE 1 TABLET UNDER THE TONGUE AS NEEDED FOR CHEST PAIN. 15 tablet 1  . atorvastatin (LIPITOR) 20 MG tablet TAKE ONE (1) TABLET BY MOUTH EVERY DAY 90 tablet 0   No current facility-administered medications on file prior to visit.    No Known Allergies  Past Medical History  Diagnosis Date  . Postmenopausal bleeding   . Endometrial mass   . Diabetes mellitus   . HTN (hypertension)   . Hyperlipidemia   . CAD (coronary artery disease)   . History of acute anterior wall MI 2001    stent prox LAD 3.0x18 Tetra  . Sinus bradycardia     Past Surgical History  Procedure Laterality Date  . Hysteroscopy    . Dilation and curettage of uterus    . Cataract extraction      bilateral  .  Appendectomy      History  Smoking status  . Never Smoker   Smokeless tobacco  . Not on file    History  Alcohol Use No    History reviewed. No pertinent family history.  Review of Systems: As noted in history of present illness. All other systems were reviewed and are negative.  Physical Exam: BP 148/60 mmHg  Pulse 72  Ht 5' (1.524 m)  Wt 104 lb 14.4 oz (47.582 kg)  BMI 20.49 kg/m2 She is a pleasant elderly white female in no acute distress. HEENT exam is unremarkable. Neck is supple without JVD, adenopathy, thyromegaly, or bruits. Lungs are clear. Cardiac exam reveals a regular rate and rhythm without gallop, murmur, or click. Abdomen is soft and nontender without masses. She has no edema. The pulses are 2+. She is alert oriented x3. Cranial nerves II through XII are intact. Normal motor and sensory exam. Gait is normal.  LABORATORY: Lab Results  Component Value Date   WBC 5.4 10/28/2008   HGB 11.7* 10/28/2008   HCT 34.6* 10/28/2008   PLT 180 10/28/2008   GLUCOSE 119* 03/01/2012   ALT 17 10/28/2008   AST 25 10/28/2008   NA 135 03/01/2012   K 3.9 03/01/2012   CL 96 03/01/2012   CREATININE 1.1 03/01/2012   BUN 22 03/01/2012   CO2 30 03/01/2012   Ecg today shows  NSR with rate 72. Otherwise normal. I have personally reviewed and interpreted this study.  Labs reviewed from Dr. Alroy Dust on 04/08/14- A1c 7.6%, glucose 128. Creatinine 1.12. Other chemistries are normal. Chol- 141, trig-110, HDL-53, LDL 87.   Assessment / Plan: 1. Coronary disease status post remote anterior myocardial infarction with stenting of the LAD. She is asymptomatic.Myoview study 3/14 showed a small apical scar without ischemia. Normal EF. We'll continue with aspirin, statin, and ACE inhibitor. Follow up in one year.  2.  TIA. Prior carotid Doppler Sept 2015 showed 40-59% right ICA stenosis.   3. Hyperlipidemia.  4. Diabetes mellitus type 2.  5. Hypertension, controlled.

## 2014-05-28 NOTE — Patient Instructions (Signed)
Continue your other therapy  I will see you in one year   

## 2014-06-05 ENCOUNTER — Encounter: Payer: Self-pay | Admitting: Cardiology

## 2014-06-24 ENCOUNTER — Other Ambulatory Visit: Payer: Self-pay | Admitting: Cardiology

## 2014-07-19 ENCOUNTER — Other Ambulatory Visit: Payer: Self-pay

## 2014-07-19 DIAGNOSIS — Z1231 Encounter for screening mammogram for malignant neoplasm of breast: Secondary | ICD-10-CM

## 2014-07-24 ENCOUNTER — Other Ambulatory Visit: Payer: Self-pay | Admitting: Cardiology

## 2014-07-24 NOTE — Telephone Encounter (Signed)
Rx(s) sent to pharmacy electronically.  

## 2014-07-30 ENCOUNTER — Ambulatory Visit
Admission: RE | Admit: 2014-07-30 | Discharge: 2014-07-30 | Disposition: A | Discharge status: Home / Self Care | Payer: Medicare Other | Source: Ambulatory Visit

## 2014-07-30 DIAGNOSIS — Z1231 Encounter for screening mammogram for malignant neoplasm of breast: Secondary | ICD-10-CM

## 2015-02-05 ENCOUNTER — Telehealth: Payer: Self-pay | Admitting: Cardiology

## 2015-02-05 NOTE — Telephone Encounter (Signed)
Closed encounter °

## 2015-05-20 ENCOUNTER — Encounter: Payer: Self-pay | Admitting: Cardiology

## 2015-05-20 ENCOUNTER — Ambulatory Visit (INDEPENDENT_AMBULATORY_CARE_PROVIDER_SITE_OTHER): Payer: Medicare Other | Admitting: Cardiology

## 2015-05-20 VITALS — BP 152/64 | HR 66 | Ht 60.0 in | Wt 101.8 lb

## 2015-05-20 DIAGNOSIS — I2510 Atherosclerotic heart disease of native coronary artery without angina pectoris: Secondary | ICD-10-CM

## 2015-05-20 DIAGNOSIS — E785 Hyperlipidemia, unspecified: Secondary | ICD-10-CM

## 2015-05-20 DIAGNOSIS — I252 Old myocardial infarction: Secondary | ICD-10-CM

## 2015-05-20 DIAGNOSIS — I10 Essential (primary) hypertension: Secondary | ICD-10-CM

## 2015-05-20 DIAGNOSIS — E119 Type 2 diabetes mellitus without complications: Secondary | ICD-10-CM | POA: Diagnosis not present

## 2015-05-20 NOTE — Progress Notes (Signed)
Cindy Harrison Date of Birth: 27-Nov-1925 Medical Record #[id number]  History of Present Illness: Cindy Harrison is seen for follow up CAD.  Cindy Harrison had an anterior myocardial infarction in 2001 initially treated with thrombolytic therapy. Cindy Harrison then underwent rotational atherectomy and stenting of a proximal LAD stenosis with a 3.0 x 18 mm Tetra stent.   Myoview study in March 2014 showed a fixed anterior apical defect without ischemia ejection fraction 62%.  On follow up today Cindy Harrison denies any recurrent angina. Cindy Harrison's had no shortness of breath or palpitations. Cindy Harrison overall feels well. Cindy Harrison does have moderate carotid arterial disease. No symptoms of TIA/CVA. Cindy Harrison was recently started on Januvia for her diabetes.    Current Outpatient Prescriptions on File Prior to Visit  Medication Sig Dispense Refill  . ACCU-CHEK FASTCLIX LANCETS MISC     . ACCU-CHEK SMARTVIEW test strip     . atorvastatin (LIPITOR) 20 MG tablet TAKE ONE (1) TABLET BY MOUTH EVERY DAY 90 tablet 3  . Blood Glucose Monitoring Suppl (ACCU-CHEK NANO SMARTVIEW) W/DEVICE KIT     . Denosumab (PROLIA [place]) Inject into the skin.    . fluocinonide cream (LIDEX) 0.05 %     . lisinopril-hydrochlorothiazide (PRINZIDE,ZESTORETIC) 20-12.5 MG per tablet TAKE ONE (1) TABLET BY MOUTH EVERY DAY 30 tablet 10  . metFORMIN (GLUCOPHAGE) 500 MG tablet Take 500 mg by mouth 4 (four) times daily.     . nitroGLYCERIN (NITROSTAT) 0.4 MG SL tablet DISSOLVE 1 TABLET UNDER THE TONGUE AS NEEDED FOR CHEST PAIN. 15 tablet 1  . triamcinolone cream (KENALOG) 0.1 % Apply 1 application topically daily as needed.     No current facility-administered medications on file prior to visit.    No Known Allergies  Past Medical History  Diagnosis Date  . Postmenopausal bleeding   . Endometrial mass   . Diabetes mellitus   . HTN (hypertension)   . Hyperlipidemia   . CAD (coronary artery disease)   . History of acute anterior wall MI 2001    stent prox LAD 3.0x18 Tetra  .  Sinus bradycardia     Past Surgical History  Procedure Laterality Date  . Hysteroscopy    . Dilation and curettage of uterus    . Cataract extraction      bilateral  . Appendectomy      History  Smoking status  . Never Smoker   Smokeless tobacco  . Not on file    History  Alcohol Use No    History reviewed. No pertinent family history.  Review of Systems: As noted in history of present illness. All other systems were reviewed and are negative.  Physical Exam: BP 152/64 mmHg  Pulse 66  Ht 5' (1.524 m)  Wt 46.176 kg (101 lb 12.8 oz)  BMI 19.88 kg/m2 Cindy Harrison is a pleasant elderly white female in no acute distress. HEENT exam is unremarkable. Neck is supple without JVD, adenopathy, thyromegaly, or bruits. Lungs are clear. Cardiac exam reveals a regular rate and rhythm without gallop, murmur, or click. Abdomen is soft and nontender without masses. Cindy Harrison has no edema. The pulses are 2+. Cindy Harrison is alert oriented x3. Cranial nerves II through XII are intact. Normal motor and sensory exam. Gait is normal.  LABORATORY: Lab Results  Component Value Date   WBC 5.4 10/28/2008   HGB 11.7* 10/28/2008   HCT 34.6* 10/28/2008   PLT 180 10/28/2008   GLUCOSE 119* 03/01/2012   ALT 17 10/28/2008   AST 25 10/28/2008  NA 135 03/01/2012   K 3.9 03/01/2012   CL 96 03/01/2012   CREATININE 1.1 03/01/2012   BUN 22 03/01/2012   CO2 30 03/01/2012   Ecg today shows NSR with rate 66. Old septal infarct. I have personally reviewed and interpreted this study.  Labs reviewed from Dr. Alroy Dust on 04/01/15 A1c 7.7%, glucose 171 Creatinine 1.14. Other chemistries are normal. Chol- 138, trig-105, HDL-62, LDL 56.   Assessment / Plan: 1. Coronary disease status post remote anterior myocardial infarction with stenting of the LAD. Cindy Harrison is asymptomatic.Myoview study 3/14 showed a small apical scar without ischemia. Normal EF. We'll continue with aspirin, statin, and ACE inhibitor. Follow up in one year.  2.   TIA. Prior carotid Doppler Sept 2015 showed 40-59% right ICA stenosis. Asymptomatic.  3. Hyperlipidemia. Well controlled.   4. Diabetes mellitus type 2.  5. Hypertension, controlled.

## 2015-05-20 NOTE — Patient Instructions (Signed)
Continue your current therapy  I will see you in one year   

## 2015-05-22 ENCOUNTER — Other Ambulatory Visit: Payer: Self-pay | Admitting: Cardiology

## 2015-05-22 NOTE — Telephone Encounter (Signed)
Rx refill sent to pharmacy. 

## 2015-08-21 ENCOUNTER — Other Ambulatory Visit: Payer: Self-pay | Admitting: *Deleted

## 2015-08-21 MED ORDER — ATORVASTATIN CALCIUM 20 MG PO TABS: 20.0000 mg | ORAL_TABLET | Freq: Every day | ORAL | 2 refills | Status: DC

## 2015-08-28 ENCOUNTER — Encounter (HOSPITAL_COMMUNITY): Payer: Self-pay

## 2015-08-28 ENCOUNTER — Emergency Department (HOSPITAL_COMMUNITY)
Admission: EM | Admit: 2015-08-28 | Discharge: 2015-08-29 | Disposition: A | Discharge status: Home / Self Care | Payer: Medicare Other | Attending: Emergency Medicine | Admitting: Emergency Medicine

## 2015-08-28 DIAGNOSIS — I2510 Atherosclerotic heart disease of native coronary artery without angina pectoris: Secondary | ICD-10-CM | POA: Diagnosis not present

## 2015-08-28 DIAGNOSIS — I10 Essential (primary) hypertension: Secondary | ICD-10-CM | POA: Insufficient documentation

## 2015-08-28 DIAGNOSIS — Z7984 Long term (current) use of oral hypoglycemic drugs: Secondary | ICD-10-CM | POA: Diagnosis not present

## 2015-08-28 DIAGNOSIS — Z79899 Other long term (current) drug therapy: Secondary | ICD-10-CM | POA: Insufficient documentation

## 2015-08-28 DIAGNOSIS — Z7982 Long term (current) use of aspirin: Secondary | ICD-10-CM | POA: Insufficient documentation

## 2015-08-28 DIAGNOSIS — M79604 Pain in right leg: Secondary | ICD-10-CM | POA: Diagnosis not present

## 2015-08-28 DIAGNOSIS — E119 Type 2 diabetes mellitus without complications: Secondary | ICD-10-CM | POA: Insufficient documentation

## 2015-08-28 LAB — CBC
HEMATOCRIT: 32.2 % — AB (ref 36.0–46.0)
Hemoglobin: 10.4 g/dL — ABNORMAL LOW (ref 12.0–15.0)
MCH: 29.2 pg (ref 26.0–34.0)
MCHC: 32.3 g/dL (ref 30.0–36.0)
MCV: 90.4 fL (ref 78.0–100.0)
PLATELETS: 272 10*3/uL (ref 150–400)
RBC: 3.56 MIL/uL — AB (ref 3.87–5.11)
RDW: 13.5 % (ref 11.5–15.5)
WBC: 5.6 10*3/uL (ref 4.0–10.5)

## 2015-08-28 LAB — BASIC METABOLIC PANEL
ANION GAP: 9 (ref 5–15)
BUN: 26 mg/dL — ABNORMAL HIGH (ref 6–20)
CALCIUM: 9.7 mg/dL (ref 8.9–10.3)
CO2: 27 mmol/L (ref 22–32)
Chloride: 100 mmol/L — ABNORMAL LOW (ref 101–111)
Creatinine, Ser: 1.5 mg/dL — ABNORMAL HIGH (ref 0.44–1.00)
GFR, EST AFRICAN AMERICAN: 35 mL/min — AB (ref >60)
GFR, EST NON AFRICAN AMERICAN: 30 mL/min — AB (ref >60)
GLUCOSE: 150 mg/dL — AB (ref 65–99)
POTASSIUM: 4 mmol/L (ref 3.5–5.1)
Sodium: 136 mmol/L (ref 135–145)

## 2015-08-28 NOTE — ED Triage Notes (Signed)
Patient here with right lower leg pain since Saturday, denies trauma, no redness, no swelling. Saw her primary MD on wednesday and treated as muscular spasm. Today pain continuing and has experienced some mild numbness to toes, positive distal pulse

## 2015-08-28 NOTE — ED Provider Notes (Signed)
TIME SEEN:  12:02 am  CHIEF COMPLAINT: right lower extremity pain   HPI:  Patient to the ER for evaluation of right lower leg pain.  She has seen her PCP for the same- felt that is was muscular. The pain started on Saturday. It hurts to walk on it as well as certain movements. She is a diabetic who has never experienced neuropathy. She denies fall or any incident that would have illicited the pain. She has not had any redness, swelling, trauma, she denies her  toes turning blue or white or feeling ice cold. No CP, SOB, back pain, headache, fever or weakness.  ROS: See HPI Constitutional: no fever  Eyes: no drainage  ENT: no runny nose   Cardiovascular:  no chest pain  Resp: no SOB  GI: no vomiting GU: no dysuria Integumentary: no rash  Allergy: no hives  Musculoskeletal: no leg swelling + Right LE pain Neurological: no slurred speech ROS otherwise negative  PAST MEDICAL HISTORY/PAST SURGICAL HISTORY:  Past Medical History:  Diagnosis Date  . CAD (coronary artery disease)   . Diabetes mellitus   . Endometrial mass   . History of acute anterior wall MI 2001   stent prox LAD 3.0x18 Tetra  . HTN (hypertension)   . Hyperlipidemia   . Postmenopausal bleeding   . Sinus bradycardia     MEDICATIONS:  Prior to Admission medications   Medication Sig Start Date End Date Taking? Authorizing Provider  ACCU-CHEK FASTCLIX LANCETS [place]  03/26/14   Historical Provider, MD  ACCU-CHEK SMARTVIEW test strip  03/26/14   Historical Provider, MD  aspirin EC 81 MG tablet Take 1 tablet (81 mg total) by mouth daily. 05/20/15   Peter M Martinique, MD  atorvastatin (LIPITOR) 20 MG tablet Take 1 tablet (20 mg total) by mouth daily at 6 PM. 08/21/15   Peter M Martinique, MD  Blood Glucose Monitoring Suppl (ACCU-CHEK NANO SMARTVIEW) W/DEVICE KIT  03/26/14   Historical Provider, MD  Denosumab (PROLIA [place]) Inject into the skin.    Historical Provider, MD  fluocinonide cream (LIDEX) 0.05 %  05/25/12   Historical  Provider, MD  lisinopril-hydrochlorothiazide (PRINZIDE,ZESTORETIC) 20-12.5 MG tablet TAKE ONE (1) TABLET BY MOUTH EVERY DAY 05/22/15   Peter M Martinique, MD  metFORMIN (GLUCOPHAGE) 500 MG tablet Take 500 mg by mouth 4 (four) times daily.     Historical Provider, MD  nitroGLYCERIN (NITROSTAT) 0.4 MG SL tablet DISSOLVE 1 TABLET UNDER THE TONGUE AS NEEDED FOR CHEST PAIN. 03/07/12   Peter M Martinique, MD  sitaGLIPtin (JANUVIA) 50 MG tablet Take 50 mg by mouth daily.    Historical Provider, MD  triamcinolone cream (KENALOG) 0.1 % Apply 1 application topically daily as needed. 04/30/14   Historical Provider, MD    ALLERGIES:  No Known Allergies  SOCIAL HISTORY:  Social History  Substance Use Topics  . Smoking status: Never Smoker  . Smokeless tobacco: Never Used  . Alcohol use No    FAMILY HISTORY: No family history on file.  EXAM: BP 152/78   Pulse 63   Temp 98 F (36.7 C) (Oral)   Resp 16   Ht 5' (1.524 m)   Wt 44.5 kg   SpO2 100%   BMI 19.14 kg/m  CONSTITUTIONAL: Alert and oriented and responds appropriately to questions. Well-appearing; well-nourished HEAD: Normocephalic EYES: Conjunctivae clear, PERRL ENT: normal nose; no rhinorrhea; moist mucous membranes NECK: Supple, no meningismus, no LAD  CARD: RRR;  no murmurs, no clicks, no rubs, no gallops  RESP: Normal chest effort of breathing. No tachypnea, excursion without; breath sounds clear and equal bilaterally; no wheezes, no rhonchi, no rales, no hypoxia or respiratory distress, speaking full sentences ABD/GI: Normal bowel sounds; non-distended; soft, non-tender, no rebound, no guarding, no peritoneal signs BACK:  The back appears normal and is non-tender to palpation, there is no CVA tenderness EXT: Normal ROM in all joints; no edema; normal capillary refill; no cyanosis,  + calf tenderness and tib/fib to right side. No swelling. Symmetrical pedal pulses- pain with ROM of right LE. SKIN: Normal color for age and race; warm; no  rash NEURO: Moves all extremities equally, sensation to light touch intact diffusely, cranial nerves II through XII intact PSYCH: The patient's mood and manner are appropriate. Grooming and personal hygiene are appropriate.  ED COURSE:  Pt declines medicine for pain at this time. Will get xray of tib/fib. If this is normal, will have patient return tomorrow morning to have le ve dopplers performed to rule out blood clot in the right lower extremity.   Patient seen by Dr. Billy Fischer as well- she agrees with treatment and plan Lab work abnormalities noted, last labs we have are from 2014, pt states her doctor has talked to her about some mild kidney concerns. She is asymptomatic. She is to f/u with PCP regarding lab-work.  Patient xray unremarkable. Pt to follow-up at 8 am for DVT study, order placed. Return precautions given.  1. Right leg pain    Medications  enoxaparin (LOVENOX) injection 45 mg (not administered)      Delos Haring, PA-C 08/29/15 0158    Delos Haring, PA-C 08/29/15 1610    Gareth Morgan, MD 08/29/15 1836    Gareth Morgan, MD 08/29/15 1840

## 2015-08-28 NOTE — ED Notes (Addendum)
Delos Haring, PA at bedside with patient/family.

## 2015-08-29 ENCOUNTER — Emergency Department (HOSPITAL_COMMUNITY): Payer: Medicare Other

## 2015-08-29 ENCOUNTER — Other Ambulatory Visit (HOSPITAL_COMMUNITY): Payer: Self-pay | Admitting: Emergency Medicine

## 2015-08-29 ENCOUNTER — Ambulatory Visit (HOSPITAL_BASED_OUTPATIENT_CLINIC_OR_DEPARTMENT_OTHER)
Admission: RE | Admit: 2015-08-29 | Discharge: 2015-08-29 | Disposition: A | Discharge status: Home / Self Care | Payer: Medicare Other | Source: Ambulatory Visit | Attending: Emergency Medicine | Admitting: Emergency Medicine

## 2015-08-29 DIAGNOSIS — R52 Pain, unspecified: Secondary | ICD-10-CM

## 2015-08-29 DIAGNOSIS — M79609 Pain in unspecified limb: Secondary | ICD-10-CM

## 2015-08-29 MED ORDER — ENOXAPARIN SODIUM 60 MG/0.6ML ~~LOC~~ SOLN
1.0000 mg/kg | Freq: Once | SUBCUTANEOUS | Status: AC
  Administered 2015-08-29: 45 mg via SUBCUTANEOUS
  Filled 2015-08-29: qty 0.6

## 2015-08-29 NOTE — ED Notes (Signed)
Patient transported to X-ray 

## 2015-08-29 NOTE — Progress Notes (Signed)
*  Preliminary Results* Right lower extremity venous duplex completed. Right lower extremity is negative for deep vein thrombosis. There is no evidence of right Baker's cyst.  The patient complains of pain in leg with ambulation. For patient peace of mind and completeness, the distal right posterior tibial artery was briefly evaluated and found to be patent with biphasic flow.  08/29/2015 9:05 AM  Maudry Mayhew, B.S., RVT, RDCS, RDMS

## 2015-09-01 ENCOUNTER — Telehealth: Payer: Self-pay | Admitting: Cardiology

## 2015-09-01 ENCOUNTER — Telehealth: Payer: Self-pay | Admitting: Cardiovascular Disease

## 2015-09-01 NOTE — Telephone Encounter (Signed)
Agree that this is unlikely related to atorvastatin but if she would like to try a different statin that is less associated with myalgias, she can try Crestor 5mg  daily.

## 2015-09-01 NOTE — Telephone Encounter (Signed)
Wrong provider

## 2015-09-01 NOTE — Telephone Encounter (Signed)
Discussed w Georgina Peer - ok to d/c atorva for 2 weeks but consider other causes as the unilateral myalgia unlikely to be caused by statin.  I relayed this information to the patient. Pt states acknowledgment, and informs me she actually has an orthopedic appt on Thursday which was set up by her PCP. She decided at this point to continue the atorvastatin and wait for ortho's recommendations. Pt was thankful for call, and aware to contact us if new concerns.

## 2015-09-01 NOTE — Telephone Encounter (Signed)
Pt have been having trouble with her right leg,it been hurting and paining for over a week. She went to her primary doctor,[facility],they have done a doppler and xrays,and blood work. They could not find anything and nothing helps the pain. She wondere if it might be the Lipitor?

## 2015-09-01 NOTE — Telephone Encounter (Signed)
Spoke to patient. She informs me she has been having right lower leg pain for about 1 week. She went to the ED the other night, has also been followed by PCP. Notes doppler and Xrays done to r/o acute concerns. No evidence of DVT.  She states she was given a shot of lovenox in hospital but since DVT r/o, not prescribed any new meds at discharge. She wonders if atorvastatin could be at issue for her symptoms, though she states that the pain is unilateral and that she has been on this medication since 2001. She informs me she has held med as of today.  Informed patient I was not aware of likelihood of her symptoms being related to atorvastatin, given her long history w/o issues on this medication. She is aware I will route to pharmD for recommendations.

## 2015-09-10 ENCOUNTER — Other Ambulatory Visit: Payer: Self-pay | Admitting: Sports Medicine

## 2015-09-10 DIAGNOSIS — M5416 Radiculopathy, lumbar region: Secondary | ICD-10-CM

## 2015-09-16 ENCOUNTER — Other Ambulatory Visit: Payer: Self-pay | Admitting: Sports Medicine

## 2015-09-16 ENCOUNTER — Ambulatory Visit
Admission: RE | Admit: 2015-09-16 | Discharge: 2015-09-16 | Disposition: A | Discharge status: Home / Self Care | Payer: Medicare Other | Source: Ambulatory Visit | Attending: Sports Medicine | Admitting: Sports Medicine

## 2015-09-16 DIAGNOSIS — M5416 Radiculopathy, lumbar region: Secondary | ICD-10-CM

## 2015-09-16 MED ORDER — METHYLPREDNISOLONE ACETATE 40 MG/ML INJ SUSP (RADIOLOG
120.0000 mg | Freq: Once | INTRAMUSCULAR | Status: AC
  Administered 2015-09-16: 120 mg via EPIDURAL

## 2015-09-16 MED ORDER — IOPAMIDOL (ISOVUE-M 200) INJECTION 41%
1.0000 mL | Freq: Once | INTRAMUSCULAR | Status: AC
  Administered 2015-09-16: 1 mL via EPIDURAL

## 2015-09-16 NOTE — Discharge Instructions (Signed)

## 2015-09-23 ENCOUNTER — Other Ambulatory Visit (HOSPITAL_COMMUNITY): Payer: Self-pay | Admitting: Radiology

## 2015-09-23 ENCOUNTER — Ambulatory Visit
Admission: RE | Admit: 2015-09-23 | Discharge: 2015-09-23 | Disposition: A | Discharge status: Home / Self Care | Payer: Medicare Other | Source: Ambulatory Visit | Attending: Radiology | Admitting: Radiology

## 2015-09-23 DIAGNOSIS — R52 Pain, unspecified: Secondary | ICD-10-CM

## 2015-09-23 DIAGNOSIS — T8089XS Other complications following infusion, transfusion and therapeutic injection, sequela: Principal | ICD-10-CM

## 2015-09-23 NOTE — Progress Notes (Signed)
Patient was seen for pain at injection site.  Please see Radiology consult note.  Tegaderm placed.  Image taken of wound for documentation.  Patient will call [facility] for symptoms of fever,  increased pain, or drainage.

## 2015-09-26 ENCOUNTER — Telehealth: Payer: Self-pay

## 2015-09-26 NOTE — Telephone Encounter (Signed)
I spoke with Cindy Harrison to see how she is feeling after her injection here 08/2915 and after she was seen here 09/23/15 for a painful wound on her right flank near where the injection was done.  She says she's made an appointment to see Dr. French Ana because her leg is still hurting and she wants another LESI.  She says the wound still is sore but seems to be healing.  She is going to have her sister-in-law take a look at it and possibly remove the tegaderm and replace them with bandaids.  jkl

## 2015-10-08 ENCOUNTER — Encounter (HOSPITAL_BASED_OUTPATIENT_CLINIC_OR_DEPARTMENT_OTHER): Payer: Medicare Other | Attending: Surgery

## 2015-10-08 DIAGNOSIS — E1136 Type 2 diabetes mellitus with diabetic cataract: Secondary | ICD-10-CM | POA: Insufficient documentation

## 2015-10-08 DIAGNOSIS — I10 Essential (primary) hypertension: Secondary | ICD-10-CM | POA: Diagnosis not present

## 2015-10-08 DIAGNOSIS — Z7984 Long term (current) use of oral hypoglycemic drugs: Secondary | ICD-10-CM | POA: Diagnosis not present

## 2015-10-08 DIAGNOSIS — L409 Psoriasis, unspecified: Secondary | ICD-10-CM | POA: Insufficient documentation

## 2015-10-08 DIAGNOSIS — Z7982 Long term (current) use of aspirin: Secondary | ICD-10-CM | POA: Diagnosis not present

## 2015-10-08 DIAGNOSIS — Z79899 Other long term (current) drug therapy: Secondary | ICD-10-CM | POA: Insufficient documentation

## 2015-10-08 DIAGNOSIS — E11622 Type 2 diabetes mellitus with other skin ulcer: Secondary | ICD-10-CM | POA: Insufficient documentation

## 2015-10-08 DIAGNOSIS — I252 Old myocardial infarction: Secondary | ICD-10-CM | POA: Insufficient documentation

## 2015-10-08 DIAGNOSIS — L97811 Non-pressure chronic ulcer of other part of right lower leg limited to breakdown of skin: Secondary | ICD-10-CM | POA: Insufficient documentation

## 2015-10-14 ENCOUNTER — Encounter (HOSPITAL_BASED_OUTPATIENT_CLINIC_OR_DEPARTMENT_OTHER): Payer: Medicare Other

## 2015-10-20 ENCOUNTER — Encounter (HOSPITAL_BASED_OUTPATIENT_CLINIC_OR_DEPARTMENT_OTHER): Payer: Medicare Other | Attending: Internal Medicine

## 2015-10-20 DIAGNOSIS — L97211 Non-pressure chronic ulcer of right calf limited to breakdown of skin: Secondary | ICD-10-CM | POA: Insufficient documentation

## 2015-10-20 DIAGNOSIS — E11622 Type 2 diabetes mellitus with other skin ulcer: Secondary | ICD-10-CM | POA: Diagnosis not present

## 2015-10-20 DIAGNOSIS — I252 Old myocardial infarction: Secondary | ICD-10-CM | POA: Diagnosis not present

## 2015-10-20 DIAGNOSIS — I10 Essential (primary) hypertension: Secondary | ICD-10-CM | POA: Insufficient documentation

## 2015-10-27 DIAGNOSIS — E11622 Type 2 diabetes mellitus with other skin ulcer: Secondary | ICD-10-CM | POA: Diagnosis not present

## 2015-11-03 DIAGNOSIS — E11622 Type 2 diabetes mellitus with other skin ulcer: Secondary | ICD-10-CM | POA: Diagnosis not present

## 2015-11-10 DIAGNOSIS — E11622 Type 2 diabetes mellitus with other skin ulcer: Secondary | ICD-10-CM | POA: Diagnosis not present

## 2015-11-17 DIAGNOSIS — E11622 Type 2 diabetes mellitus with other skin ulcer: Secondary | ICD-10-CM | POA: Diagnosis not present

## 2015-11-24 ENCOUNTER — Encounter (HOSPITAL_BASED_OUTPATIENT_CLINIC_OR_DEPARTMENT_OTHER): Payer: Medicare Other | Attending: Internal Medicine

## 2015-11-24 DIAGNOSIS — I10 Essential (primary) hypertension: Secondary | ICD-10-CM | POA: Diagnosis not present

## 2015-11-24 DIAGNOSIS — I252 Old myocardial infarction: Secondary | ICD-10-CM | POA: Insufficient documentation

## 2015-11-24 DIAGNOSIS — E11622 Type 2 diabetes mellitus with other skin ulcer: Secondary | ICD-10-CM | POA: Diagnosis present

## 2015-11-24 DIAGNOSIS — X58XXXA Exposure to other specified factors, initial encounter: Secondary | ICD-10-CM | POA: Diagnosis not present

## 2015-11-24 DIAGNOSIS — S31000A Unspecified open wound of lower back and pelvis without penetration into retroperitoneum, initial encounter: Secondary | ICD-10-CM | POA: Diagnosis not present

## 2016-05-19 ENCOUNTER — Ambulatory Visit (HOSPITAL_COMMUNITY)
Admission: RE | Admit: 2016-05-19 | Discharge: 2016-05-19 | Disposition: A | Discharge status: Home / Self Care | Payer: Medicare Other | Source: Ambulatory Visit | Attending: Vascular Surgery | Admitting: Vascular Surgery

## 2016-05-19 ENCOUNTER — Other Ambulatory Visit (HOSPITAL_COMMUNITY): Payer: Self-pay | Admitting: Family Medicine

## 2016-05-19 DIAGNOSIS — M79604 Pain in right leg: Secondary | ICD-10-CM | POA: Diagnosis not present

## 2016-06-03 ENCOUNTER — Other Ambulatory Visit: Payer: Self-pay | Admitting: Cardiology

## 2016-06-07 ENCOUNTER — Other Ambulatory Visit: Payer: Self-pay | Admitting: Cardiology

## 2016-06-07 NOTE — Telephone Encounter (Signed)
Rx request sent to pharmacy.  

## 2016-06-09 NOTE — Progress Notes (Signed)
Cindy Harrison Date of Birth: [birth year] Medical Record #[id number]  History of Present Illness: [name] is seen for follow up CAD.  She had an anterior myocardial infarction in 2001 initially treated with thrombolytic therapy. She then underwent rotational atherectomy and stenting of a proximal LAD stenosis with a 3.0 x 18 mm Tetra stent.   Myoview study in March 2014 showed a fixed anterior apical defect without ischemia ejection fraction 62%.   She was evaluated in the ED in August 2017 with right leg pain and swelling. Venous doppler negative for DVT. Seen by ortho. Underwent L5 nerve root injection for a slipped disc. Reports she had cellulitis in right leg last month.  Had repeat venous dopplers 05/25/16 which were again negative for DVT. Symptoms resolved with antibiotics. She also had recent steroid dose pack for gout in hands.   On follow up today she denies any recurrent angina. She denies any shortness of breath or palpitations. She overall feels well. She remains active.   Current Outpatient Prescriptions on File Prior to Visit  Medication Sig Dispense Refill  . ACCU-CHEK FASTCLIX LANCETS MISC     . ACCU-CHEK SMARTVIEW test strip     . aspirin EC 81 MG tablet Take 1 tablet (81 mg total) by mouth daily. 90 tablet 3  . atorvastatin (LIPITOR) 20 MG tablet TAKE ONE (1) TABLET BY MOUTH EVERY DAY AT SIX IN THE EVENING 90 tablet 1  . Blood Glucose Monitoring Suppl (ACCU-CHEK NANO SMARTVIEW) W/DEVICE KIT     . Denosumab (PROLIA [place]) Inject into the skin.    . fluocinonide cream (LIDEX) 0.05 %     . lisinopril-hydrochlorothiazide (PRINZIDE,ZESTORETIC) 20-12.5 MG tablet TAKE ONE (1) TABLET BY MOUTH EVERY DAY 30 tablet 1  . metFORMIN (GLUCOPHAGE) 500 MG tablet Take 500 mg by mouth 2 (two) times daily.     . nitroGLYCERIN (NITROSTAT) 0.4 MG SL tablet DISSOLVE 1 TABLET UNDER THE TONGUE AS NEEDED FOR CHEST PAIN. 15 tablet 1  . sitaGLIPtin (JANUVIA) 50 MG tablet Take 50 mg by mouth daily.     Marland Kitchen triamcinolone cream (KENALOG) 0.1 % Apply 1 application topically daily as needed.     No current facility-administered medications on file prior to visit.     No Known Allergies  Past Medical History:  Diagnosis Date  . CAD (coronary artery disease)   . Diabetes mellitus   . Endometrial mass   . History of acute anterior wall MI 2001   stent prox LAD 3.0x18 Tetra  . HTN (hypertension)   . Hyperlipidemia   . Postmenopausal bleeding   . Sinus bradycardia     Past Surgical History:  Procedure Laterality Date  . APPENDECTOMY    . CATARACT EXTRACTION     bilateral  . DILATION AND CURETTAGE OF UTERUS    . HYSTEROSCOPY      History  Smoking Status  . Never Smoker  Smokeless Tobacco  . Never Used    History  Alcohol Use No    No family history on file.  Review of Systems: As noted in history of present illness. All other systems were reviewed and are negative.  Physical Exam: BP (!) 132/50   Pulse 72   Ht 5' (1.524 m)   Wt 102 lb (46.3 kg)   BMI 19.92 kg/m  She is a pleasant elderly white female in no acute distress. HEENT exam is unremarkable. Neck is supple without JVD, adenopathy, thyromegaly, or bruits. Lungs are clear. Cardiac exam reveals  a regular rate and rhythm without gallop, murmur, or click. Abdomen is soft and nontender without masses. She has no edema. The pulses are 2+. She is alert oriented x3. Cranial nerves II through XII are intact. Normal motor and sensory exam. Gait is normal.  LABORATORY: Lab Results  Component Value Date   WBC 5.6 08/28/2015   HGB 10.4 (L) 08/28/2015   HCT 32.2 (L) 08/28/2015   PLT 272 08/28/2015   GLUCOSE 150 (H) 08/28/2015   ALT 17 10/28/2008   AST 25 10/28/2008   NA 136 08/28/2015   K 4.0 08/28/2015   CL 100 (L) 08/28/2015   CREATININE 1.50 (H) 08/28/2015   BUN 26 (H) 08/28/2015   CO2 27 08/28/2015   Labs reviewed from Dr. Alroy Dust on 03/31/16: A1c 7.6%, glucose 179 Creatinine 1.15. Other chemistries are  normal. Chol- 134, trig-170, HDL-64, LDL 56.   Assessment / Plan: 1. Coronary disease status post remote anterior myocardial infarction with stenting of the LAD. She is asymptomatic. Myoview study 3/14 showed a small apical scar without ischemia. Normal EF. We'll continue with aspirin, statin, and ACE inhibitor. Follow up in one year.  2. Hyperlipidemia. Well controlled.   4. Diabetes mellitus type 2. Management per primary care.  5. Hypertension, controlled.

## 2016-06-10 ENCOUNTER — Ambulatory Visit (INDEPENDENT_AMBULATORY_CARE_PROVIDER_SITE_OTHER): Payer: Medicare Other | Admitting: Cardiology

## 2016-06-10 ENCOUNTER — Encounter: Payer: Self-pay | Admitting: Cardiology

## 2016-06-10 VITALS — BP 132/50 | HR 72 | Ht 60.0 in | Wt 102.0 lb

## 2016-06-10 DIAGNOSIS — E7800 Pure hypercholesterolemia, unspecified: Secondary | ICD-10-CM | POA: Diagnosis not present

## 2016-06-10 DIAGNOSIS — I2510 Atherosclerotic heart disease of native coronary artery without angina pectoris: Secondary | ICD-10-CM | POA: Diagnosis not present

## 2016-06-10 DIAGNOSIS — I10 Essential (primary) hypertension: Secondary | ICD-10-CM

## 2016-06-10 NOTE — Patient Instructions (Addendum)
Continue your current therapy  I will see you in one year   

## 2016-08-10 ENCOUNTER — Other Ambulatory Visit: Payer: Self-pay | Admitting: Cardiology

## 2016-12-22 ENCOUNTER — Other Ambulatory Visit: Payer: Self-pay | Admitting: Cardiology

## 2017-03-01 ENCOUNTER — Ambulatory Visit: Payer: Medicare Other | Admitting: Podiatry

## 2017-03-01 DIAGNOSIS — E119 Type 2 diabetes mellitus without complications: Secondary | ICD-10-CM

## 2017-03-01 DIAGNOSIS — Q828 Other specified congenital malformations of skin: Secondary | ICD-10-CM

## 2017-03-01 DIAGNOSIS — M205X1 Other deformities of toe(s) (acquired), right foot: Secondary | ICD-10-CM

## 2017-03-06 NOTE — Progress Notes (Signed)
Subjective:   Patient ID: Cindy Harrison, female   DOB: 84 y.o.   MRN: [id number]   HPI Cindy Harrison presents the office today for concerns of a painful corn of the right fifth toe which is been ongoing for about 2 months.  She is tried medicated over-the-counter corn pads without any significant improvement.  She denies any recent injury or trauma.  She denies any swelling or redness or any drainage.  She is tried changing her shoes which helps some but now it starts the shoes she is wearing.  She has no other concerns today.   Review of Systems  All other systems reviewed and are negative.  Past Medical History:  Diagnosis Date  . CAD (coronary artery disease)   . Diabetes mellitus   . Endometrial mass   . History of acute anterior wall MI 2001   stent prox LAD 3.0x18 Tetra  . HTN (hypertension)   . Hyperlipidemia   . Postmenopausal bleeding   . Sinus bradycardia     Past Surgical History:  Procedure Laterality Date  . APPENDECTOMY    . CATARACT EXTRACTION     bilateral  . DILATION AND CURETTAGE OF UTERUS    . HYSTEROSCOPY       Current Outpatient Medications:  .  ACCU-CHEK FASTCLIX LANCETS MISC, , Disp: , Rfl:  .  ACCU-CHEK SMARTVIEW test strip, , Disp: , Rfl:  .  aspirin EC 81 MG tablet, Take 1 tablet (81 mg total) by mouth daily., Disp: 90 tablet, Rfl: 3 .  atorvastatin (LIPITOR) 20 MG tablet, TAKE ONE (1) TABLET BY MOUTH EVERY DAY AT 6:00PM IN THE EVENING, Disp: 90 tablet, Rfl: 1 .  Blood Glucose Monitoring Suppl (ACCU-CHEK NANO SMARTVIEW) W/DEVICE KIT, , Disp: , Rfl:  .  Denosumab (PROLIA [place]), Inject into the skin., Disp: , Rfl:  .  fluocinonide cream (LIDEX) 0.05 %, , Disp: , Rfl:  .  glipiZIDE (GLUCOTROL XL) 2.5 MG 24 hr tablet, Take 1 tablet by mouth daily., Disp: , Rfl:  .  lisinopril-hydrochlorothiazide (PRINZIDE,ZESTORETIC) 20-12.5 MG tablet, TAKE ONE (1) TABLET BY MOUTH EVERY DAY, Disp: 30 tablet, Rfl: 1 .  metFORMIN (GLUCOPHAGE) 500 MG tablet, Take 500 mg by mouth  2 (two) times daily. , Disp: , Rfl:  .  nitroGLYCERIN (NITROSTAT) 0.4 MG SL tablet, DISSOLVE 1 TABLET UNDER THE TONGUE AS NEEDED FOR CHEST PAIN., Disp: 15 tablet, Rfl: 1 .  sitaGLIPtin (JANUVIA) 50 MG tablet, Take 50 mg by mouth daily., Disp: , Rfl:  .  triamcinolone cream (KENALOG) 0.1 %, Apply 1 application topically daily as needed., Disp: , Rfl:   No Known Allergies  Social History   Socioeconomic History  . Marital status: Widowed    Spouse name: Not on file  . Number of children: 3  . Years of education: Not on file  . Highest education level: Not on file  Social Needs  . Financial resource strain: Not on file  . Food insecurity - worry: Not on file  . Food insecurity - inability: Not on file  . Transportation needs - medical: Not on file  . Transportation needs - non-medical: Not on file  Occupational History  . Not on file  Tobacco Use  . Smoking status: Never Smoker  . Smokeless tobacco: Never Used  Substance and Sexual Activity  . Alcohol use: No  . Drug use: No  . Sexual activity: Not on file  Other Topics Concern  . Not on file  Social History Narrative  .  Not on file        Objective:  Physical Exam  General: AAO x3, NAD  Dermatological: Punctate annular hyperkeratotic lesion to the medial aspect of the right fifth toe at the DIPJ.  This is abutting into the fourth toe causing pressure to the fourth toe but there is no skin breakdown across the fourth toe at this time.  Upon debridement there is no underlying ulceration or drainage or any signs of infection of the fifth toe.  No other open lesions or pre-ulcerative lesion identified today.  Vascular: Dorsalis Pedis artery and Posterior Tibial artery pedal pulses are 2/4 bilateral with immedate capillary fill time. There is no pain with calf compression, swelling, warmth, erythema.   Neruologic: Grossly intact via light touch bilateral. Protective threshold with Semmes Wienstein monofilament intact to all  pedal sites bilateral.   Musculoskeletal: Adductovarus is present in the fourth and fifth toes.  Muscular strength 5/5 in all groups tested bilateral.  Gait: Unassisted, Nonantalgic.      Assessment:   Hyperkeratotic lesion right fifth toe     Plan:  -Treatment options discussed including all alternatives, risks, and complications -Etiology of symptoms were discussed -I sharply debrided the hyperkeratotic lesion to the right fifth toe today without any complications or bleeding.  Upon debridement there is resolution of symptoms and on her way out the appointment today she states that her pain had completely resolved.  Dispensed offloading pads.  Discussed change in shoes.  When the area comes back to call the office and we can try this as needed.  Discussed with her likely recurrence. -Follow-up as needed.  Call any questions or concerns.  Trula Slade DPM

## 2017-03-13 ENCOUNTER — Ambulatory Visit (HOSPITAL_COMMUNITY)
Admission: EM | Admit: 2017-03-13 | Discharge: 2017-03-13 | Disposition: A | Discharge status: Home / Self Care | Payer: Medicare Other

## 2017-03-13 ENCOUNTER — Other Ambulatory Visit: Payer: Self-pay

## 2017-03-13 ENCOUNTER — Encounter (HOSPITAL_COMMUNITY): Payer: Self-pay | Admitting: Emergency Medicine

## 2017-03-13 DIAGNOSIS — S61211A Laceration without foreign body of left index finger without damage to nail, initial encounter: Secondary | ICD-10-CM | POA: Diagnosis not present

## 2017-03-13 NOTE — Discharge Instructions (Signed)
Apply Neosporin and keep some pressure on the wound.  Wash with soap and water twice daily and reapply bandage.

## 2017-03-13 NOTE — ED Triage Notes (Signed)
Pt states she sliced open her pointer finger on her L hand while cutting cake today around 11, wrapped it up and noticed later on it was still bleeding. Denies pain.

## 2017-03-13 NOTE — ED Provider Notes (Addendum)
03/13/2017 4:08 PM   DOB: February 18, 1925 / MRN: [id number]  SUBJECTIVE:  Cindy Harrison is a 84 y.o. female presenting for finger laceration.  This occurred this morning.  Laceration is on the anterior aspect of the left pointer finger just proximal to the DIP.  She has no weakness.  She is here because she has had difficulty with controlling the bleeding and she has a trip to the [place] tomorrow.  She does take ASA 81 daily.   ROS As per HPI  OBJECTIVE:  BP (!) 174/76   Pulse 96   Temp (!) 97.5 F (36.4 C)   Resp 18   SpO2 100%   Physical Exam  Constitutional: She is active.  Non-toxic appearance.  Cardiovascular: Normal rate, regular rhythm and normal heart sounds.  Pulmonary/Chest: Effort normal. No tachypnea.  Musculoskeletal:       Hands: Neurological: She is alert.  Skin: Skin is warm and dry. She is not diaphoretic. No pallor.  Psychiatric: Her mood appears anxious.    No results found for this or any previous visit (from the past 72 hour(s)).  No results found.  ASSESSMENT AND PLAN:  Orders Placed This Encounter  Procedures  . Wound care    Standing Status:   Standing    Number of Occurrences:   1     Laceration of left index finger without foreign body without damage to nail, initial encounter: Uncomplicated and very superficial.  She receives her vaccinations from a provider outside of epic.      The patient is advised to call or return to clinic if she does not see an improvement in symptoms, or to seek the care of the closest emergency department if she worsens with the above plan.   Philis Fendt, MHS, PA-C 03/13/2017 4:08 PM        Tereasa Coop, PA-C 03/13/17 1609

## 2017-05-13 ENCOUNTER — Encounter: Payer: Self-pay | Admitting: Cardiology

## 2017-05-29 NOTE — Progress Notes (Signed)
Cindy Harrison Date of Birth: [birth year] Medical Record #[id number]  History of Present Illness: Cindy Harrison is seen for follow up CAD.  She had an anterior myocardial infarction in 2001 initially treated with thrombolytic therapy. She then underwent rotational atherectomy and stenting of a proximal LAD stenosis with a 3.0 x 18 mm Tetra stent.   Myoview study in March 2014 showed a fixed anterior apical defect without ischemia ejection fraction 62%.   On follow up today she is doing very well. turned 89 this year. Remains very active. Denies any recurrent angina, dyspnea, or palpitations. Notes mild swelling in her left ankle and noted her HCTZ was increased.   Current Outpatient Medications on File Prior to Visit  Medication Sig Dispense Refill  . ACCU-CHEK FASTCLIX LANCETS MISC     . ACCU-CHEK SMARTVIEW test strip     . allopurinol (ZYLOPRIM) 100 MG tablet Take 100 mg by mouth daily.    Marland Kitchen aspirin EC 81 MG tablet Take 1 tablet (81 mg total) by mouth daily. 90 tablet 3  . atorvastatin (LIPITOR) 20 MG tablet TAKE ONE (1) TABLET BY MOUTH EVERY DAY AT 6:00PM IN THE EVENING 90 tablet 1  . Blood Glucose Monitoring Suppl (ACCU-CHEK NANO SMARTVIEW) W/DEVICE KIT     . Calcium Carb-Cholecalciferol (CALCIUM 1000 + D PO) Take by mouth.    . Denosumab (PROLIA [place]) Inject into the skin.    . fluocinonide cream (LIDEX) 0.05 %     . glipiZIDE (GLUCOTROL XL) 2.5 MG 24 hr tablet Take 1 tablet by mouth daily.    Marland Kitchen lisinopril-hydrochlorothiazide (PRINZIDE,ZESTORETIC) 20-25 MG tablet Take 1 tablet by mouth daily.    . metFORMIN (GLUCOPHAGE) 500 MG tablet Take 500 mg by mouth 2 (two) times daily.     . nitroGLYCERIN (NITROSTAT) 0.4 MG SL tablet DISSOLVE 1 TABLET UNDER THE TONGUE AS NEEDED FOR CHEST PAIN. 15 tablet 1  . sitaGLIPtin (JANUVIA) 50 MG tablet Take 50 mg by mouth daily.    Marland Kitchen triamcinolone cream (KENALOG) 0.1 % Apply 1 application topically daily as needed.     No current facility-administered  medications on file prior to visit.     No Known Allergies  Past Medical History:  Diagnosis Date  . CAD (coronary artery disease)   . Diabetes mellitus   . Endometrial mass   . History of acute anterior wall MI 2001   stent prox LAD 3.0x18 Tetra  . HTN (hypertension)   . Hyperlipidemia   . Postmenopausal bleeding   . Sinus bradycardia     Past Surgical History:  Procedure Laterality Date  . APPENDECTOMY    . CATARACT EXTRACTION     bilateral  . DILATION AND CURETTAGE OF UTERUS    . HYSTEROSCOPY      Social History   Tobacco Use  Smoking Status Never Smoker  Smokeless Tobacco Never Used    Social History   Substance and Sexual Activity  Alcohol Use No    History reviewed. No pertinent family history.  Review of Systems: As noted in history of present illness. All other systems were reviewed and are negative.  Physical Exam: BP (!) 132/50   Pulse 75   Ht 5' (1.524 m)   Wt 106 lb 12.8 oz (48.4 kg)   BMI 20.86 kg/m  GENERAL:  Well appearing, elderly WF in NAD HEENT:  PERRL, EOMI, sclera are clear. Oropharynx is clear. NECK:  No jugular venous distention, carotid upstroke brisk and symmetric, no bruits, no thyromegaly  or adenopathy LUNGS:  Clear to auscultation bilaterally CHEST:  Unremarkable HEART:  RRR,  PMI not displaced or sustained,S1 and S2 within normal limits, no S3, no S4: no clicks, no rubs, no murmurs ABD:  Soft, nontender. BS +, no masses or bruits. No hepatomegaly, no splenomegaly EXT:  2 + pulses throughout, trace left ankle edema, no cyanosis no clubbing SKIN:  Warm and dry.  No rashes NEURO:  Alert and oriented x 3. Cranial nerves II through XII intact. PSYCH:  Cognitively intact    LABORATORY: Lab Results  Component Value Date   WBC 5.6 08/28/2015   HGB 10.4 (L) 08/28/2015   HCT 32.2 (L) 08/28/2015   PLT 272 08/28/2015   GLUCOSE 150 (H) 08/28/2015   ALT 17 10/28/2008   AST 25 10/28/2008   NA 136 08/28/2015   K 4.0 08/28/2015    CL 100 (L) 08/28/2015   CREATININE 1.50 (H) 08/28/2015   BUN 26 (H) 08/28/2015   CO2 27 08/28/2015   Labs reviewed from Dr. Alroy Dust on 03/31/16: A1c 7.6%, glucose 179 Creatinine 1.15. Other chemistries are normal. Chol- 134, trig-170, HDL-64, LDL 56.  Dated 04/01/17: A1c 6.8%. BUN 31, creatinine 1.36. Cholesterol 159, triglycerides 75, HDL 69, LDL 74. Other chemistries normal.   Ecg today shows NSR rate 75. First degree AV block. Otherwise normal. I have personally reviewed and interpreted this study.   Assessment / Plan: 1. Coronary disease status post remote anterior myocardial infarction with stenting of the LAD. She is asymptomatic. Myoview study 3/14 showed a small apical scar without ischemia. Normal EF. We'll continue with aspirin, statin, and ACE inhibitor. Follow up in one year.  2. Hyperlipidemia. Well controlled.   4. Diabetes mellitus type 2. A1c 6.8%. Management per primary care.  5. Hypertension, controlled.

## 2017-06-01 ENCOUNTER — Encounter: Payer: Self-pay | Admitting: Cardiology

## 2017-06-01 ENCOUNTER — Ambulatory Visit: Payer: Medicare Other | Admitting: Cardiology

## 2017-06-01 VITALS — BP 132/50 | HR 75 | Ht 60.0 in | Wt 106.8 lb

## 2017-06-01 DIAGNOSIS — E7800 Pure hypercholesterolemia, unspecified: Secondary | ICD-10-CM | POA: Diagnosis not present

## 2017-06-01 DIAGNOSIS — I2510 Atherosclerotic heart disease of native coronary artery without angina pectoris: Secondary | ICD-10-CM | POA: Diagnosis not present

## 2017-06-01 DIAGNOSIS — I10 Essential (primary) hypertension: Secondary | ICD-10-CM | POA: Diagnosis not present

## 2017-06-01 MED ORDER — NITROGLYCERIN 0.4 MG SL SUBL: SUBLINGUAL_TABLET | SUBLINGUAL | 1 refills | Status: DC

## 2017-06-01 NOTE — Patient Instructions (Signed)
Continue your current therapy  Follow up in one year 

## 2017-07-05 ENCOUNTER — Other Ambulatory Visit: Payer: Self-pay | Admitting: Cardiology

## 2018-04-14 ENCOUNTER — Other Ambulatory Visit: Payer: Self-pay | Admitting: Family Medicine

## 2018-04-14 ENCOUNTER — Ambulatory Visit
Admission: RE | Admit: 2018-04-14 | Discharge: 2018-04-14 | Disposition: A | Discharge status: Home / Self Care | Payer: Medicare Other | Source: Ambulatory Visit | Attending: Family Medicine | Admitting: Family Medicine

## 2018-04-14 DIAGNOSIS — M79644 Pain in right finger(s): Secondary | ICD-10-CM

## 2018-06-03 NOTE — Progress Notes (Signed)
 Virtual Visit via Telephone Note   This visit type was conducted due to national recommendations for restrictions regarding the COVID-19 Pandemic (e.g. social distancing) in an effort to limit this patient's exposure and mitigate transmission in our community.  Due to her co-morbid illnesses, this patient is at least at moderate risk for complications without adequate follow up.  This format is felt to be most appropriate for this patient at this time.  The patient did not have access to video technology/had technical difficulties with video requiring transitioning to audio format only (telephone).  All issues noted in this document were discussed and addressed.  No physical exam could be performed with this format.  Please refer to the patient's chart for her  consent to telehealth for [facility].   Date:  06/05/2018   ID:  Cindy Harrison, DOB 01/12/1926, MRN [id number]  Patient Location: Home Provider Location: Home  PCP:  Mitchell, L.Dean, MD  Cardiologist:  Peter Jordan MD Electrophysiologist:  None   Evaluation Performed:  Follow-Up Visit  Chief Complaint:  Follow up CAD  History of Present Illness:    Cindy Harrison is a 85 y.o. female is seen for follow up CAD.  She had an anterior myocardial infarction in 2001 initially treated with thrombolytic therapy. She then underwent rotational atherectomy and stenting of a proximal LAD stenosis with a 3.0 x 18 mm Tetra stent.   Myoview study in March 2014 showed a fixed anterior apical defect without ischemia ejection fraction 62%. She has a history of DM type 2, HTN, HLD.   On follow up today she notes that for the last 2 weeks she has experienced symptoms of chest pain/discomfort. This usually occurs in the evening or at night. Is not related to activity or meals. Has not tried taking Ntg. Feels like her bra is too tight but symptoms continue even when she takes her bra off. It does come and go. Thinks is may be similar to symptoms  she had remotely with her MI.   The patient does have symptoms concerning for COVID-19 infection (fever, chills, cough, or new shortness of breath).    Past Medical History:  Diagnosis Date  . CAD (coronary artery disease)   . Diabetes mellitus   . Endometrial mass   . History of acute anterior wall MI 2001   stent prox LAD 3.0x18 Tetra  . HTN (hypertension)   . Hyperlipidemia   . Postmenopausal bleeding   . Sinus bradycardia    Past Surgical History:  Procedure Laterality Date  . APPENDECTOMY    . CATARACT EXTRACTION     bilateral  . DILATION AND CURETTAGE OF UTERUS    . HYSTEROSCOPY       Current Meds  Medication Sig  . ACCU-CHEK FASTCLIX LANCETS MISC   . ACCU-CHEK SMARTVIEW test strip   . allopurinol (ZYLOPRIM) 100 MG tablet Take 100 mg by mouth daily.  . aspirin EC 81 MG tablet Take 1 tablet (81 mg total) by mouth daily.  . atorvastatin (LIPITOR) 20 MG tablet TAKE ONE (1) TABLET BY MOUTH EVERY DAY AT 6:00PM IN THE EVENING  . Blood Glucose Monitoring Suppl (ACCU-CHEK NANO SMARTVIEW) W/DEVICE KIT   . Calcium Carb-Cholecalciferol (CALCIUM 1000 + D PO) Take by mouth.  . glipiZIDE (GLUCOTROL XL) 2.5 MG 24 hr tablet Take 1 tablet by mouth daily.  . lisinopril-hydrochlorothiazide (PRINZIDE,ZESTORETIC) 20-25 MG tablet Take 1 tablet by mouth daily.  . nitroGLYCERIN (NITROSTAT) 0.4 MG SL tablet DISSOLVE 1 TABLET   UNDER THE TONGUE AS NEEDED FOR CHEST PAIN.  . sitaGLIPtin (JANUVIA) 50 MG tablet Take 50 mg by mouth daily.  Marland Kitchen triamcinolone cream (KENALOG) 0.1 % Apply 1 application topically daily as needed.  . [DISCONTINUED] nitroGLYCERIN (NITROSTAT) 0.4 MG SL tablet DISSOLVE 1 TABLET UNDER THE TONGUE AS NEEDED FOR CHEST PAIN.     Allergies:   Patient has no known allergies.   Social History   Tobacco Use  . Smoking status: Never Smoker  . Smokeless tobacco: Never Used  Substance Use Topics  . Alcohol use: No  . Drug use: No     Family Hx: The patient's family history is  not on file.  ROS:   Please see the history of present illness.    All other systems reviewed and are negative.   Prior CV studies:   The following studies were reviewed today:  none  Labs/Other Tests and Data Reviewed:    EKG:  No ECG reviewed.  Recent Labs: No results found for requested labs within last 8760 hours.   Recent Lipid Panel No results found for: CHOL, TRIG, HDL, CHOLHDL, LDLCALC, LDLDIRECT   Dated 04/04/18: cholesterol 138, triglycerides 68, HDL 67, LDL 57, A1c 7.1%. creatinine 1.58. Hgb 10.6. other chemistries and CBC normal.  Wt Readings from Last 3 Encounters:  06/05/18 105 lb (47.6 kg)  06/01/17 106 lb 12.8 oz (48.4 kg)  06/10/16 102 lb (46.3 kg)     Objective:    Vital Signs:  Ht 4' 9" (1.448 m)   Wt 105 lb (47.6 kg)   BMI 22.72 kg/m    VITAL SIGNS:  reviewed  ASSESSMENT & PLAN:    1. Coronary disease status post remote anterior myocardial infarction with stenting of the LAD in 2001. Myoview study 3/14 showed a small apical scar without ischemia. Normal EF. She is having some chest pain symptoms over the past 2 weeks of uncertain etiology but potentially angina. We'll continue with aspirin, statin, and ACE inhibitor. I will add Toprol XL 25 mg daily. I have instructed her to use sl Ntg on a prn basis and reviewed carefully how to take it. Will will arrange for a lexiscan myoview study.   2. Hyperlipidemia. Well controlled.   4. Diabetes mellitus type 2. A1c 7.1%. Management per primary care.  5. Hypertension, reports BP controlled.  COVID-19 Education: The signs and symptoms of COVID-19 were discussed with the patient and how to seek care for testing (follow up with PCP or arrange E-visit).  The importance of social distancing was discussed today.  Time:   Today, I have spent 20 minutes with the patient with telehealth technology discussing the above problems.     Medication Adjustments/Labs and Tests Ordered: Current medicines are  reviewed at length with the patient today.  Concerns regarding medicines are outlined above.   Tests Ordered: No orders of the defined types were placed in this encounter.   Medication Changes: Meds ordered this encounter  Medications  . nitroGLYCERIN (NITROSTAT) 0.4 MG SL tablet    Sig: DISSOLVE 1 TABLET UNDER THE TONGUE AS NEEDED FOR CHEST PAIN.    Dispense:  15 tablet    Refill:  1  . metoprolol succinate (TOPROL XL) 25 MG 24 hr tablet    Sig: Take 1 tablet (25 mg total) by mouth daily.    Dispense:  90 tablet    Refill:  3    Disposition:  Follow up to be determined.   Signed,  [place], MD  06/05/2018  11:20 AM    [facility],  

## 2018-06-05 ENCOUNTER — Encounter: Payer: Self-pay | Admitting: Cardiology

## 2018-06-05 ENCOUNTER — Telehealth (INDEPENDENT_AMBULATORY_CARE_PROVIDER_SITE_OTHER): Payer: Medicare Other | Admitting: Cardiology

## 2018-06-05 VITALS — Ht <= 58 in | Wt 105.0 lb

## 2018-06-05 DIAGNOSIS — I25118 Atherosclerotic heart disease of native coronary artery with other forms of angina pectoris: Secondary | ICD-10-CM

## 2018-06-05 DIAGNOSIS — E7800 Pure hypercholesterolemia, unspecified: Secondary | ICD-10-CM

## 2018-06-05 DIAGNOSIS — I10 Essential (primary) hypertension: Secondary | ICD-10-CM

## 2018-06-05 MED ORDER — NITROGLYCERIN 0.4 MG SL SUBL: SUBLINGUAL_TABLET | SUBLINGUAL | 1 refills | Status: AC

## 2018-06-05 MED ORDER — METOPROLOL SUCCINATE ER 25 MG PO TB24: 25.0000 mg | ORAL_TABLET | Freq: Every day | ORAL | 3 refills | Status: DC

## 2018-06-05 MED FILL — NITROGLYCERIN 0.4 MG TAB SL: 0.4 | 10 days supply | Qty: 25 | Fill #0

## 2018-06-05 MED FILL — ACCU-CHEK GUIDE TEST STRIP: 90 days supply | Qty: 100 | Fill #0

## 2018-06-05 MED FILL — ACCU-CHEK FASTCLIX LANCETS: 90 days supply | Qty: 102 | Fill #0

## 2018-06-05 MED FILL — METOPROLOL SUCCINATE ER 25: 25 | 90 days supply | Qty: 90 | Fill #0

## 2018-06-05 NOTE — Addendum Note (Signed)
Addended by: Kathyrn Lass on: 06/05/2018 11:29 AM   Modules accepted: Orders

## 2018-06-05 NOTE — Patient Instructions (Addendum)
Start Toprol XL 25 mg daily  Use Ntg sublingual PRN chest pain  We will arrange for you to have a nuclear stress test  Lexiscan Myoview  If your symptoms of chest pain worsen please let me know

## 2018-06-06 MED FILL — TRIAMCINOLONE 0.1% CREAM: 0.1 | 23 days supply | Qty: 120 | Fill #0

## 2018-06-07 ENCOUNTER — Telehealth (HOSPITAL_COMMUNITY): Payer: Self-pay

## 2018-06-07 NOTE — Telephone Encounter (Signed)
Encounter complete. 

## 2018-06-08 ENCOUNTER — Ambulatory Visit (HOSPITAL_COMMUNITY)
Admission: RE | Admit: 2018-06-08 | Discharge: 2018-06-08 | Disposition: A | Discharge status: Home / Self Care | Payer: Medicare Other | Source: Ambulatory Visit | Attending: Cardiology | Admitting: Cardiology

## 2018-06-08 ENCOUNTER — Other Ambulatory Visit: Payer: Self-pay

## 2018-06-08 DIAGNOSIS — I25118 Atherosclerotic heart disease of native coronary artery with other forms of angina pectoris: Secondary | ICD-10-CM

## 2018-06-08 DIAGNOSIS — E7800 Pure hypercholesterolemia, unspecified: Secondary | ICD-10-CM | POA: Diagnosis not present

## 2018-06-08 DIAGNOSIS — I10 Essential (primary) hypertension: Secondary | ICD-10-CM | POA: Diagnosis not present

## 2018-06-08 LAB — MYOCARDIAL PERFUSION IMAGING
LV dias vol: 65 mL (ref 46–106)
LV sys vol: 31 mL
Peak HR: 82 {beats}/min
Rest HR: 62 {beats}/min
SDS: 2
SRS: 6
SSS: 8
TID: 1.36

## 2018-06-08 MED ORDER — TECHNETIUM TC 99M TETROFOSMIN IV KIT
8.3000 | PACK | Freq: Once | INTRAVENOUS | Status: AC | PRN
  Administered 2018-06-08: 8.3 via INTRAVENOUS
  Filled 2018-06-08: qty 9

## 2018-06-08 MED ORDER — REGADENOSON 0.4 MG/5ML IV SOLN
0.4000 mg | Freq: Once | INTRAVENOUS | Status: AC
  Administered 2018-06-08: 0.4 mg via INTRAVENOUS

## 2018-06-08 MED ORDER — TECHNETIUM TC 99M TETROFOSMIN IV KIT
26.5000 | PACK | Freq: Once | INTRAVENOUS | Status: AC | PRN
  Administered 2018-06-08: 26.5 via INTRAVENOUS
  Filled 2018-06-08: qty 27

## 2018-08-15 ENCOUNTER — Other Ambulatory Visit: Payer: Self-pay | Admitting: Cardiology

## 2018-12-12 ENCOUNTER — Other Ambulatory Visit: Payer: Self-pay

## 2018-12-12 ENCOUNTER — Ambulatory Visit (HOSPITAL_COMMUNITY)
Admission: EM | Admit: 2018-12-12 | Discharge: 2018-12-12 | Disposition: A | Discharge status: Home / Self Care | Payer: Medicare Other | Attending: Family Medicine | Admitting: Family Medicine

## 2018-12-12 ENCOUNTER — Encounter (HOSPITAL_COMMUNITY): Payer: Self-pay | Admitting: Emergency Medicine

## 2018-12-12 DIAGNOSIS — S51012A Laceration without foreign body of left elbow, initial encounter: Secondary | ICD-10-CM | POA: Diagnosis not present

## 2018-12-12 NOTE — ED Notes (Signed)
Applied guaze, kerlix to left arm at triage

## 2018-12-12 NOTE — ED Triage Notes (Addendum)
Tripped in side house.  Tripped over a walker and fell to floor.  Patient walked into a dark room where walker was sitting  No loc.  Patient did not hit her head.  Patient has skin tears from and including left forearm and just above left elbow.  Small skin tear to right forearm.

## 2018-12-12 NOTE — ED Provider Notes (Signed)
[facility]    CSN: [id number] Arrival date & time: 12/12/18  1809      History   Chief Complaint Chief Complaint  Patient presents with  . Fall    HPI Cindy Harrison is a 85 y.o. female.   HPI   Walked into dark room in home Tripped over furniture Has abrasions both forearms with skin tears Tetanus up to date Here for wound care  Past Medical History:  Diagnosis Date  . CAD (coronary artery disease)   . Diabetes mellitus   . Endometrial mass   . History of acute anterior wall MI 2001   stent prox LAD 3.0x18 Tetra  . HTN (hypertension)   . Hyperlipidemia   . Postmenopausal bleeding   . Sinus bradycardia     Patient Active Problem List   Diagnosis Date Noted  . TIA (transient ischemic attack) 03/01/2012  . Diabetes mellitus type 2, controlled, without complications ([place])   . HTN (hypertension)   . Hyperlipidemia   . CAD (coronary artery disease)   . History of acute anterior wall MI     Past Surgical History:  Procedure Laterality Date  . APPENDECTOMY    . CATARACT EXTRACTION     bilateral  . DILATION AND CURETTAGE OF UTERUS    . HYSTEROSCOPY      OB History   No obstetric history on file.      Home Medications    Prior to Admission medications   Medication Sig Start Date End Date Taking? Authorizing Provider  allopurinol (ZYLOPRIM) 100 MG tablet Take 100 mg by mouth daily.    [provider]  aspirin EC 81 MG tablet Take 1 tablet (81 mg total) by mouth daily. 05/20/15   Martinique, Peter M, MD  atorvastatin (LIPITOR) 20 MG tablet TAKE ONE (1) TABLET BY MOUTH EVERY DAY AT 6:00PM IN THE EVENING 08/15/18   Martinique, Peter M, MD  Blood Glucose Monitoring Suppl (ACCU-CHEK NANO SMARTVIEW) W/DEVICE KIT  03/26/14   [provider]  Calcium Carb-Cholecalciferol (CALCIUM 1000 + D PO) Take by mouth.    [provider]  glipiZIDE (GLUCOTROL XL) 2.5 MG 24 hr tablet Take 1 tablet by mouth daily. 06/07/16   [provider]  lisinopril-hydrochlorothiazide (PRINZIDE,ZESTORETIC) 20-25 MG tablet Take 1 tablet by mouth daily.    [provider]  metoprolol succinate (TOPROL XL) 25 MG 24 hr tablet Take 1 tablet (25 mg total) by mouth daily. 06/05/18   Martinique, Peter M, MD  nitroGLYCERIN (NITROSTAT) 0.4 MG SL tablet DISSOLVE 1 TABLET UNDER THE TONGUE AS NEEDED FOR CHEST PAIN. 06/05/18   Martinique, Peter M, MD  sitaGLIPtin (JANUVIA) 50 MG tablet Take 50 mg by mouth daily.    [provider]  triamcinolone cream (KENALOG) 0.1 % Apply 1 application topically daily as needed. 04/30/14   [provider]    Family History History reviewed. No pertinent family history.  Social History Social History   Tobacco Use  . Smoking status: Never Smoker  . Smokeless tobacco: Never Used  Substance Use Topics  . Alcohol use: No  . Drug use: No     Allergies   Patient has no known allergies.   Review of Systems Review of Systems  Constitutional: Negative for chills and fever.  HENT: Negative for ear pain and sore throat.   Eyes: Negative for pain and visual disturbance.  Respiratory: Negative for cough and shortness of breath.   Cardiovascular: Negative for chest pain and  palpitations.  Gastrointestinal: Negative for abdominal pain and vomiting.  Genitourinary: Negative for dysuria and hematuria.  Musculoskeletal: Negative for arthralgias and back pain.  Skin: Positive for wound. Negative for color change and rash.  Neurological: Negative for seizures and syncope.  All other systems reviewed and are negative.    Physical Exam Triage Vital Signs ED Triage Vitals  Enc Vitals Group     BP 12/12/18 1858 136/63     Pulse Rate 12/12/18 1858 69     Resp 12/12/18 1858 20     Temp 12/12/18 1858 97.7 F (36.5 C)     Temp Source 12/12/18 1858 Oral     SpO2 12/12/18 1858 98 %     Weight --      Height --      Head Circumference --      Peak Flow --      Pain Score 12/12/18 1855 5     Pain  Loc --      Pain Edu? --      Excl. in [employer]? --    No data found.  Updated Vital Signs BP 136/63 (BP Location: Right Arm)   Pulse 69   Temp 97.7 F (36.5 C) (Oral)   Resp 20   SpO2 98%       Physical Exam Constitutional:      General: She is not in acute distress.    Appearance: She is well-developed and normal weight.     Comments: Small 85 year old woman.  Alert and talkative.  Somewhat frail.  Walks in a slightly stooped posture with small steps  HENT:     Head: Normocephalic and atraumatic.     Mouth/Throat:     Comments: Mask in place Eyes:     Conjunctiva/sclera: Conjunctivae normal.     Pupils: Pupils are equal, round, and reactive to light.  Neck:     Musculoskeletal: Normal range of motion.  Cardiovascular:     Rate and Rhythm: Normal rate.  Pulmonary:     Effort: Pulmonary effort is normal. No respiratory distress.  Abdominal:     General: There is no distension.     Palpations: Abdomen is soft.  Musculoskeletal: Normal range of motion.  Skin:    General: Skin is warm and dry.     Comments: Both forearms with a number of skin tears.  The left forearm has 1 that is a C-shaped, 3 cm.  Cleansed with skin cleanser and closed with 2/4 inch Steri-Strips.  The left forearm has 4 lacerations, the largest of which is 8 cm, C-shaped.  All are open.  Slightly bleeding.  Closed and then bleeding stopped with pressure.  Steri-Strip.  Dressed.  Neurological:     General: No focal deficit present.     Mental Status: She is alert.     Gait: Gait abnormal.  Psychiatric:        Mood and Affect: Mood normal.        Behavior: Behavior normal.      UC Treatments / Results  Labs (all labs ordered are listed, but only abnormal results are displayed) Labs Reviewed - No data to display  EKG   Radiology No results found.  Procedures Procedures (including critical care time)  Medications Ordered in UC Medications - No data to display  Initial Impression / Assessment  and Plan / UC Course  I have reviewed the triage vital signs and the nursing notes.  Pertinent labs & imaging results that were available during  my care of the patient were reviewed by me and considered in my medical decision making (see chart for details).     Wound care discussed Final Clinical Impressions(s) / UC Diagnoses   Final diagnoses:  Skin tear of left elbow without complication, initial encounter     Discharge Instructions     Keep clean and dry Watch for infection Change dressing every couple of days Return as needed See your doctor in follow up next week    ED Prescriptions    None     PDMP not reviewed this encounter.   Raylene Everts, MD 12/12/18 [phone]

## 2018-12-12 NOTE — Discharge Instructions (Addendum)
Keep clean and dry Watch for infection Change dressing every couple of days Return as needed See your doctor in follow up next week

## 2019-04-03 ENCOUNTER — Other Ambulatory Visit: Payer: Self-pay | Admitting: Cardiology

## 2019-05-20 IMAGING — CR RIGHT INDEX FINGER 2+V
3 series · 3 of 3 positions shown · non-contrast
Comparison: None.

CLINICAL DATA: Pain and swelling

EXAM:
RIGHT SECOND FINGER 2+V

[x finger pa right]
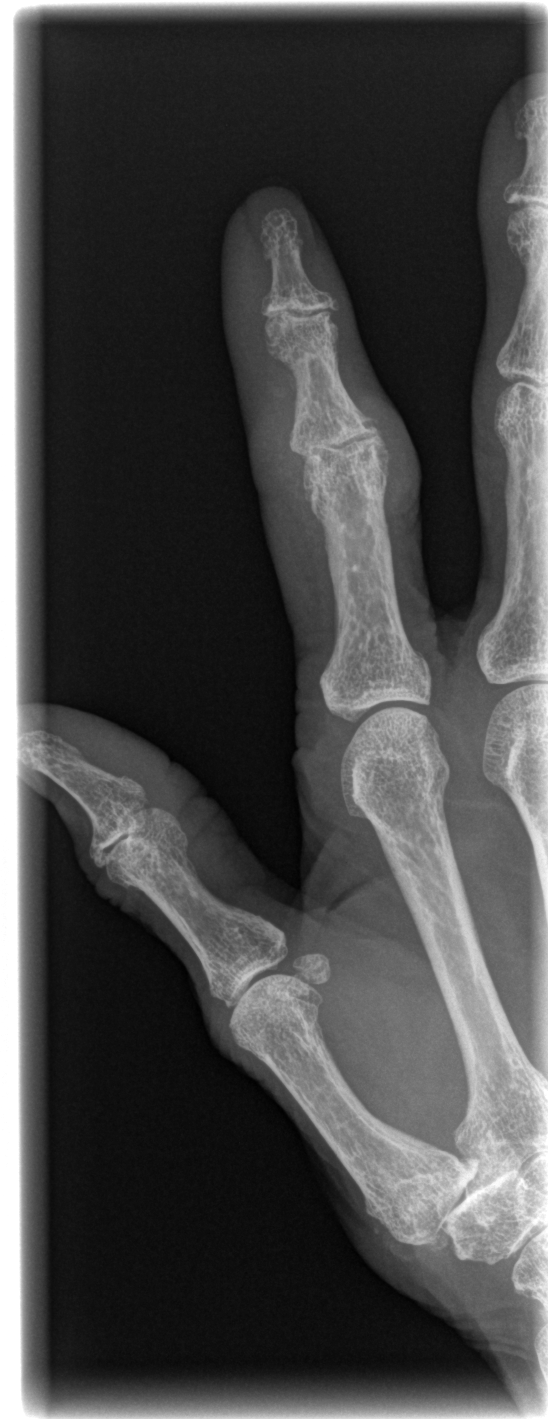

[x finger obl. right]
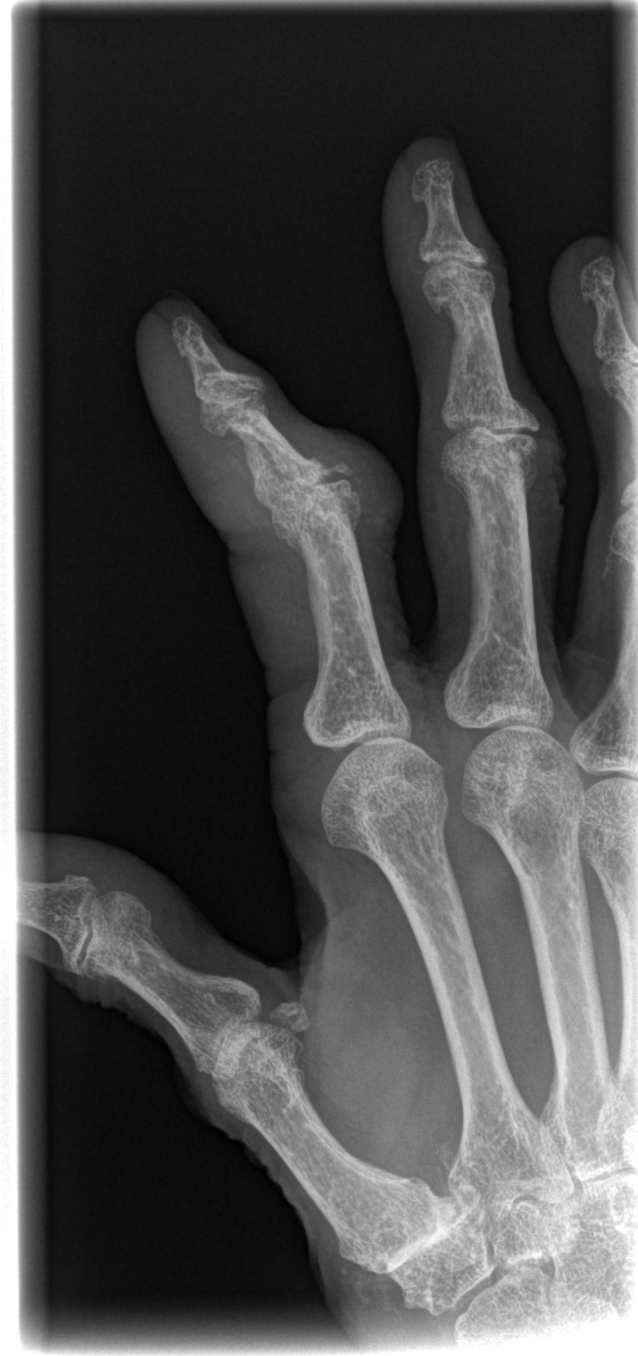

[x finger lateral right]
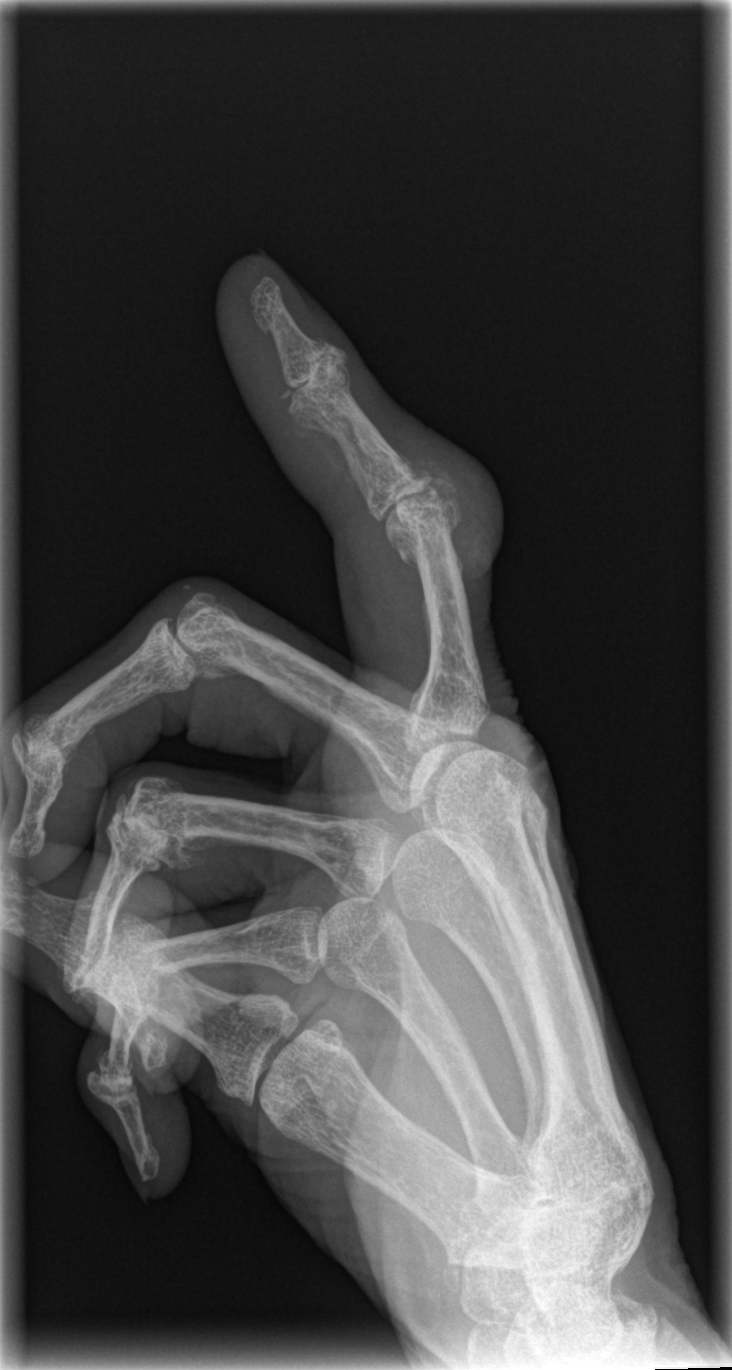

[3 of 3 positions shown; findings below may reference images not displayed]

FINDINGS: Frontal, oblique, and lateral views obtained. There is marked soft
tissue swelling in the second PIP joint region. No soft tissue air.
There is subtle calcification within this area of soft tissue
swelling.

There is narrowing of the second PIP and DIP joints. Similar changes
are also noted in the first IP and third PIP and DIP joints. No
erosions are appreciable. There is spurring in these areas. Bones
are osteoporotic. No fracture or dislocation.
IMPRESSION: Changes most consistent with osteoarthritis in the second PIP and
DIP joints. There is marked soft tissue swelling in the second PIP
joint with subtle calcification. Suspect inflammatory arthropathy in
this area as most likely etiology. Infection could present in this
manner, although there is no erosion or bony destruction evident.
There is underlying osteoporosis. No fracture or dislocation.

## 2019-06-28 NOTE — Progress Notes (Signed)
Cindy Harrison Date of Birth: [birth year] Medical Record #[id number]  History of Present Illness: Cindy Harrison is seen for follow up CAD.  She had an anterior myocardial infarction in 2001 initially treated with thrombolytic therapy. She then underwent rotational atherectomy and stenting of a proximal LAD stenosis with a 3.0 x 18 mm Tetra stent.   Myoview study in March 2014 showed a fixed anterior apical defect without ischemia ejection fraction 62%.   She was seen for a televisit in May 2020 and complained of chest pain. A Myoview study was done and showed a small area of infarct. No ischemia, normal EF 53%. Medical therapy was recommended.   On follow up today she is doing very well.  Remains very active. Denies any recurrent angina, dyspnea, or palpitations. No new health concerns.  Current Outpatient Medications on File Prior to Visit  Medication Sig Dispense Refill   allopurinol (ZYLOPRIM) 100 MG tablet Take 100 mg by mouth daily.     aspirin EC 81 MG tablet Take 1 tablet (81 mg total) by mouth daily. 90 tablet 3   atorvastatin (LIPITOR) 20 MG tablet TAKE ONE (1) TABLET BY MOUTH EVERY DAY AT 6:00PM IN THE EVENING 90 tablet 2   Blood Glucose Monitoring Suppl (ACCU-CHEK NANO SMARTVIEW) W/DEVICE KIT      Calcium Carb-Cholecalciferol (CALCIUM 1000 + D PO) Take by mouth.     GLIPIZIDE XL 5 MG 24 hr tablet Take 5 mg by mouth daily.     lisinopril-hydrochlorothiazide (PRINZIDE,ZESTORETIC) 20-25 MG tablet Take 1 tablet by mouth daily.     nitroGLYCERIN (NITROSTAT) 0.4 MG SL tablet DISSOLVE 1 TABLET UNDER THE TONGUE AS NEEDED FOR CHEST PAIN. 15 tablet 1   sitaGLIPtin (JANUVIA) 50 MG tablet Take 50 mg by mouth daily.     triamcinolone cream (KENALOG) 0.1 % Apply 1 application topically daily as needed.     No current facility-administered medications on file prior to visit.    No Known Allergies  Past Medical History:  Diagnosis Date   CAD (coronary artery disease)     Diabetes mellitus    Endometrial mass    History of acute anterior wall MI 2001   stent prox LAD 3.0x18 Tetra   HTN (hypertension)    Hyperlipidemia    Postmenopausal bleeding    Sinus bradycardia     Past Surgical History:  Procedure Laterality Date   APPENDECTOMY     CATARACT EXTRACTION     bilateral   DILATION AND CURETTAGE OF UTERUS     HYSTEROSCOPY      Social History   Tobacco Use  Smoking Status Never Smoker  Smokeless Tobacco Never Used    Social History   Substance and Sexual Activity  Alcohol Use No    History reviewed. No pertinent family history.  Review of Systems: As noted in history of present illness. All other systems were reviewed and are negative.  Physical Exam: BP (!) 120/52    Pulse 64    Ht _0  (1.448 m)    Wt 101 lb 12.8 oz (46.2 kg)    SpO2 97%    BMI 22.03 kg/m  GENERAL:  Well appearing, elderly WF in NAD HEENT:  PERRL, EOMI, sclera are clear. Oropharynx is clear. NECK:  No jugular venous distention, carotid upstroke brisk and symmetric, no bruits, no thyromegaly or adenopathy LUNGS:  Clear to auscultation bilaterally CHEST:  Unremarkable HEART:  RRR,  PMI not displaced or sustained,S1 and S2 within normal limits, no  S3, no S4: no clicks, no rubs, no murmurs ABD:  Soft, nontender. BS +, no masses or bruits. No hepatomegaly, no splenomegaly EXT:  2 + pulses throughout, trace left ankle edema, no cyanosis no clubbing SKIN:  Warm and dry.  No rashes NEURO:  Alert and oriented x 3. Cranial nerves II through XII intact. PSYCH:  Cognitively intact    LABORATORY: Lab Results  Component Value Date   WBC 5.6 08/28/2015   HGB 10.4 (L) 08/28/2015   HCT 32.2 (L) 08/28/2015   PLT 272 08/28/2015   GLUCOSE 150 (H) 08/28/2015   ALT 17 10/28/2008   AST 25 10/28/2008   NA 136 08/28/2015   K 4.0 08/28/2015   CL 100 (L) 08/28/2015   CREATININE 1.50 (H) 08/28/2015   BUN 26 (H) 08/28/2015   CO2 27 08/28/2015   Labs reviewed from  Dr. Alroy Dust on 03/31/16: A1c 7.6%, glucose 179 Creatinine 1.15. Other chemistries are normal. Chol- 134, trig-170, HDL-64, LDL 56.  Dated 04/01/17: A1c 6.8%. BUN 31, creatinine 1.36. Cholesterol 159, triglycerides 75, HDL 69, LDL 74. Other chemistries normal.  Dated 04/04/19: A1c 7%. Cholesterol 154, triglycerides 84, HDL 74, LDL 64. Creatinine 1.52. CMET otherwise normal.  Ecg today shows NSR rate 64. Old septal infarct. Otherwise normal. I have personally reviewed and interpreted this study.  Myoview 06/08/18: Study Highlights   The left ventricular ejection fraction is mildly decreased (45-54%).  Nuclear stress EF: 53%.  There was no ST segment deviation noted during stress.  Defect 1: There is a medium defect of moderate severity present in the apical anterior, apical septal and apex location.  Findings consistent with prior myocardial infarction, unchanged from prior.  This is a low risk study.    Assessment / Plan: 1. Coronary disease status post remote anterior myocardial infarction with stenting of the LAD. She is asymptomatic. Myoview study 5/20 showed a small apical scar without ischemia. Normal EF. No change from prior. We'll continue with aspirin, statin, and ACE inhibitor. Follow up in one year.  2. Hyperlipidemia. Well controlled.   4. Diabetes mellitus type 2. A1c 7%. Management per primary care.  5. Hypertension, controlled.

## 2019-06-29 ENCOUNTER — Other Ambulatory Visit: Payer: Self-pay

## 2019-06-29 ENCOUNTER — Ambulatory Visit: Payer: Medicare Other | Admitting: Cardiology

## 2019-06-29 ENCOUNTER — Encounter: Payer: Self-pay | Admitting: Cardiology

## 2019-06-29 VITALS — BP 120/52 | HR 64 | Ht <= 58 in | Wt 101.8 lb

## 2019-06-29 DIAGNOSIS — I10 Essential (primary) hypertension: Secondary | ICD-10-CM

## 2019-06-29 DIAGNOSIS — E7800 Pure hypercholesterolemia, unspecified: Secondary | ICD-10-CM

## 2019-06-29 DIAGNOSIS — I25118 Atherosclerotic heart disease of native coronary artery with other forms of angina pectoris: Secondary | ICD-10-CM | POA: Diagnosis not present

## 2019-06-29 NOTE — Patient Instructions (Signed)
Medication Instructions:  Your physician recommends that you continue on your current medications as directed. Please refer to the Current Medication list given to you today.  *If you need a refill on your cardiac medications before your next appointment, please call your pharmacy*  Lab Work: NONE If you have labs (blood work) drawn today and your tests are completely normal, you will receive your results only by: Marland Kitchen MyChart Message (if you have MyChart) OR . A paper copy in the mail If you have any lab test that is abnormal or we need to change your treatment, we will call you to review the results.  Testing/Procedures: NONE  Follow-Up: At [facility], you and your health needs are our priority.  As part of our continuing mission to provide you with exceptional heart care, we have created designated Provider Care Teams.  These Care Teams include your primary Cardiologist (physician) and Advanced Practice Providers (APPs -  Physician Assistants and Nurse Practitioners) who all work together to provide you with the care you need, when you need it.  We recommend signing up for the patient portal called "MyChart".  Sign up information is provided on this After Visit Summary.  MyChart is used to connect with patients for Virtual Visits (Telemedicine).  Patients are able to view lab/test results, encounter notes, upcoming appointments, etc.  Non-urgent messages can be sent to your provider as well.   To learn more about what you can do with MyChart, go to [email or web address].    Your next appointment:   12 month(s)You will receive a reminder letter in the mail two months in advance. If you don't receive a letter, please call our office to schedule the follow-up appointment.  The format for your next appointment:   In Person  Provider:   You may see DR Martinique  or one of the following Advanced Practice Providers on your designated Care Team:    Almyra Deforest, PA-C  Fabian Sharp, PA-C or    Roby Lofts, [place]

## 2019-07-19 DIAGNOSIS — C801 Malignant (primary) neoplasm, unspecified: Secondary | ICD-10-CM

## 2019-07-19 HISTORY — DX: Malignant (primary) neoplasm, unspecified: C80.1

## 2019-08-10 ENCOUNTER — Other Ambulatory Visit (HOSPITAL_COMMUNITY): Payer: Self-pay | Admitting: Family Medicine

## 2019-08-22 ENCOUNTER — Other Ambulatory Visit: Payer: Self-pay

## 2019-08-22 ENCOUNTER — Encounter (HOSPITAL_BASED_OUTPATIENT_CLINIC_OR_DEPARTMENT_OTHER): Payer: Self-pay | Admitting: Plastic Surgery

## 2019-08-23 NOTE — H&P (Signed)
Subjective:     Patient ID: Cindy Harrison is a 86 y.o. female.  HPI  Here for follow up discussion prior to planned skin graft to scalp. Underwent Mohs procedure to scalp for recurrent SCC 8.2.21. Treated 2017 with ED&C for SSCIS vertex scalp and underwent repeat [facility] 04/2019 for SCC per chart notes. Per patient has had chronic open wound for 8 years off and on since prior Mohs to area. Reports skin graft at that time.  PMH significant for DM no recent HbA1c available, CAD/hx MI 2001 s/p coronary stenting, HTN, HLD. Followed by Dr. Martinique, last seen 06/2019  Lives alone, 3 sons in area.  Review of Systems     Objective:   Physical Exam Cardiovascular:     Rate and Rhythm: Normal rate and regular rhythm.     Heart sounds: Normal heart sounds.  Pulmonary:     Effort: Pulmonary effort is normal.     Breath sounds: Normal breath sounds.    R chest transverse scar- per patient site prior skin graft donor site  HEENT: open wound vertex scalp 3.5 x 3 cm in greatest dimensions with exposed bone devoid periosteum anterior 1/3 to 1/2 wound   Assessment:     Mohs defect scalp Recurrent SCC scalp    Plan:     Patient and family have elected for skin graft. Plan full thickness skin graft from left upper arm to scalp. Reviewed burring of bone prior to application, GA, OP surgery, post operative bolster dressing over graft that must remain dry. Reviewed risk failure graft need for additional procedures prolonged wound healing.  Prior to surgery ok to remove dressing and shower and apply vaseline or Aquaphor to area. Dressing change done with nurse today and family.  Rx for Tramadol given

## 2019-08-24 ENCOUNTER — Other Ambulatory Visit (HOSPITAL_COMMUNITY)
Admission: RE | Admit: 2019-08-24 | Discharge: 2019-08-24 | Disposition: A | Discharge status: Home / Self Care | Payer: Medicare Other | Source: Ambulatory Visit | Attending: Plastic Surgery | Admitting: Plastic Surgery

## 2019-08-24 DIAGNOSIS — Z01812 Encounter for preprocedural laboratory examination: Secondary | ICD-10-CM | POA: Diagnosis present

## 2019-08-24 DIAGNOSIS — Z20822 Contact with and (suspected) exposure to covid-19: Secondary | ICD-10-CM | POA: Insufficient documentation

## 2019-08-24 LAB — SARS CORONAVIRUS 2 (TAT 6-24 HRS): SARS Coronavirus 2: NEGATIVE

## 2019-08-27 NOTE — Anesthesia Preprocedure Evaluation (Addendum)
Anesthesia Evaluation  Patient identified by MRN, date of birth, ID band Patient awake    Reviewed: Allergy & Precautions, NPO status , Patient's Chart, lab work & pertinent test results  History of Anesthesia Complications Negative for: history of anesthetic complications  Airway Mallampati: II  TM Distance: >3 FB Neck ROM: Full    Dental  (+) Upper Dentures, Dental Advisory Given   Pulmonary neg pulmonary ROS,    Pulmonary exam normal        Cardiovascular hypertension, Pt. on medications + CAD, + Past MI and + Cardiac Stents  Normal cardiovascular exam   '20 Myoperfusion - Nuclear stress EF: 53%. There was no ST segment deviation noted during stress. Defect 1: There is a medium defect of moderate severity present in the apical anterior, apical septal and apex location. Findings consistent with prior myocardial infarction, unchanged from prior. This is a low risk study.    Neuro/Psych negative psych ROS   GI/Hepatic negative GI ROS, Neg liver ROS,   Endo/Other  diabetes, Type 2, Oral Hypoglycemic Agents  Renal/GU negative Renal ROS     Musculoskeletal negative musculoskeletal ROS (+)   Abdominal   Peds  Hematology negative hematology ROS (+)   Anesthesia Other Findings Covid test negative   Reproductive/Obstetrics                            Anesthesia Physical Anesthesia Plan  ASA: III  Anesthesia Plan: General   Post-op Pain Management:    Induction: Intravenous  PONV Risk Score and Plan: 3 and Treatment may vary due to age or medical condition and Ondansetron  Airway Management Planned: LMA  Additional Equipment: None  Intra-op Plan:   Post-operative Plan: Extubation in OR  Informed Consent: I have reviewed the patients History and Physical, chart, labs and discussed the procedure including the risks, benefits and alternatives for the proposed anesthesia with  the patient or authorized representative who has indicated his/her understanding and acceptance.     Dental advisory given  Plan Discussed with: CRNA and Anesthesiologist  Anesthesia Plan Comments:        Anesthesia Quick Evaluation

## 2019-08-28 ENCOUNTER — Other Ambulatory Visit: Payer: Self-pay

## 2019-08-28 ENCOUNTER — Ambulatory Visit (HOSPITAL_BASED_OUTPATIENT_CLINIC_OR_DEPARTMENT_OTHER): Payer: Medicare Other | Admitting: Anesthesiology

## 2019-08-28 ENCOUNTER — Ambulatory Visit (HOSPITAL_BASED_OUTPATIENT_CLINIC_OR_DEPARTMENT_OTHER)
Admission: RE | Admit: 2019-08-28 | Discharge: 2019-08-28 | Disposition: A | Discharge status: Home / Self Care | Payer: Medicare Other | Attending: Plastic Surgery | Admitting: Plastic Surgery

## 2019-08-28 ENCOUNTER — Encounter (HOSPITAL_BASED_OUTPATIENT_CLINIC_OR_DEPARTMENT_OTHER)
Admission: RE | Disposition: A | Discharge status: Home / Self Care | Payer: Self-pay | Source: Home / Self Care | Attending: Plastic Surgery

## 2019-08-28 ENCOUNTER — Encounter (HOSPITAL_BASED_OUTPATIENT_CLINIC_OR_DEPARTMENT_OTHER): Payer: Self-pay | Admitting: Plastic Surgery

## 2019-08-28 DIAGNOSIS — E119 Type 2 diabetes mellitus without complications: Secondary | ICD-10-CM | POA: Diagnosis not present

## 2019-08-28 DIAGNOSIS — Z955 Presence of coronary angioplasty implant and graft: Secondary | ICD-10-CM | POA: Insufficient documentation

## 2019-08-28 DIAGNOSIS — I2510 Atherosclerotic heart disease of native coronary artery without angina pectoris: Secondary | ICD-10-CM | POA: Diagnosis not present

## 2019-08-28 DIAGNOSIS — I10 Essential (primary) hypertension: Secondary | ICD-10-CM | POA: Insufficient documentation

## 2019-08-28 DIAGNOSIS — C4442 Squamous cell carcinoma of skin of scalp and neck: Secondary | ICD-10-CM | POA: Diagnosis not present

## 2019-08-28 DIAGNOSIS — E785 Hyperlipidemia, unspecified: Secondary | ICD-10-CM | POA: Insufficient documentation

## 2019-08-28 DIAGNOSIS — Y838 Other surgical procedures as the cause of abnormal reaction of the patient, or of later complication, without mention of misadventure at the time of the procedure: Secondary | ICD-10-CM | POA: Insufficient documentation

## 2019-08-28 DIAGNOSIS — T8189XA Other complications of procedures, not elsewhere classified, initial encounter: Secondary | ICD-10-CM | POA: Diagnosis not present

## 2019-08-28 DIAGNOSIS — Z428 Encounter for other plastic and reconstructive surgery following medical procedure or healed injury: Secondary | ICD-10-CM | POA: Diagnosis not present

## 2019-08-28 DIAGNOSIS — I252 Old myocardial infarction: Secondary | ICD-10-CM | POA: Diagnosis not present

## 2019-08-28 HISTORY — PX: SKIN FULL THICKNESS GRAFT: SHX442

## 2019-08-28 LAB — GLUCOSE, CAPILLARY
Glucose-Capillary: 140 mg/dL — ABNORMAL HIGH (ref 70–99)
Glucose-Capillary: 156 mg/dL — ABNORMAL HIGH (ref 70–99)

## 2019-08-28 SURGERY — APPLICATION, GRAFT, SKIN, FULL-THICKNESS
Anesthesia: General | Site: Scalp

## 2019-08-28 MED ORDER — LACTATED RINGERS IV SOLN: INTRAVENOUS | Status: DC

## 2019-08-28 MED ORDER — PROPOFOL 10 MG/ML IV BOLUS
INTRAVENOUS | Status: AC
  Filled 2019-08-28: qty 20

## 2019-08-28 MED ORDER — ACETAMINOPHEN 325 MG PO TABS
650.0000 mg | ORAL_TABLET | Freq: Four times a day (QID) | ORAL | Status: DC | PRN
  Administered 2019-08-28: 650 mg via ORAL

## 2019-08-28 MED ORDER — BACITRACIN ZINC 500 UNIT/GM EX OINT
TOPICAL_OINTMENT | CUTANEOUS | Status: DC | PRN
  Administered 2019-08-28: 1 via TOPICAL

## 2019-08-28 MED ORDER — EPHEDRINE SULFATE 50 MG/ML IJ SOLN
INTRAMUSCULAR | Status: DC | PRN
  Administered 2019-08-28 (×2): 10 mg via INTRAVENOUS

## 2019-08-28 MED ORDER — LIDOCAINE HCL (CARDIAC) PF 100 MG/5ML IV SOSY
PREFILLED_SYRINGE | INTRAVENOUS | Status: DC | PRN
  Administered 2019-08-28: 40 mg via INTRAVENOUS

## 2019-08-28 MED ORDER — ARTIFICIAL TEARS OPHTHALMIC OINT
TOPICAL_OINTMENT | OPHTHALMIC | Status: DC | PRN
  Administered 2019-08-28: 1 via OPHTHALMIC

## 2019-08-28 MED ORDER — CEFAZOLIN SODIUM-DEXTROSE 2-4 GM/100ML-% IV SOLN
INTRAVENOUS | Status: AC
  Filled 2019-08-28: qty 100

## 2019-08-28 MED ORDER — BACITRACIN ZINC 500 UNIT/GM EX OINT
TOPICAL_OINTMENT | CUTANEOUS | Status: AC
  Filled 2019-08-28: qty 28.35

## 2019-08-28 MED ORDER — BUPIVACAINE HCL (PF) 0.25 % IJ SOLN
INTRAMUSCULAR | Status: AC
  Filled 2019-08-28: qty 30

## 2019-08-28 MED ORDER — FENTANYL CITRATE (PF) 100 MCG/2ML IJ SOLN
INTRAMUSCULAR | Status: DC | PRN
  Administered 2019-08-28 (×2): 25 ug via INTRAVENOUS

## 2019-08-28 MED ORDER — BACITRACIN ZINC 500 UNIT/GM EX OINT
TOPICAL_OINTMENT | CUTANEOUS | Status: AC
  Filled 2019-08-28: qty 1.8

## 2019-08-28 MED ORDER — SODIUM CHLORIDE 0.9 % IV SOLN
INTRAVENOUS | Status: DC | PRN
  Administered 2019-08-28: 40 ug via INTRAVENOUS

## 2019-08-28 MED ORDER — LIDOCAINE HCL (CARDIAC) PF 100 MG/5ML IV SOSY: PREFILLED_SYRINGE | INTRAVENOUS | Status: DC | PRN

## 2019-08-28 MED ORDER — LIDOCAINE-EPINEPHRINE 1 %-1:100000 IJ SOLN
INTRAMUSCULAR | Status: AC
  Filled 2019-08-28: qty 1

## 2019-08-28 MED ORDER — PROPOFOL 10 MG/ML IV BOLUS
INTRAVENOUS | Status: DC | PRN
Start: 2019-08-28 — End: 2019-08-28
  Administered 2019-08-28: 120 mg via INTRAVENOUS

## 2019-08-28 MED ORDER — DEXAMETHASONE SODIUM PHOSPHATE 10 MG/ML IJ SOLN
INTRAMUSCULAR | Status: DC | PRN
  Administered 2019-08-28: 5 mg via INTRAVENOUS

## 2019-08-28 MED ORDER — CEFAZOLIN SODIUM-DEXTROSE 2-4 GM/100ML-% IV SOLN
2.0000 g | INTRAVENOUS | Status: AC
  Administered 2019-08-28: 2 g via INTRAVENOUS

## 2019-08-28 MED ORDER — DEXAMETHASONE SODIUM PHOSPHATE 10 MG/ML IJ SOLN
INTRAMUSCULAR | Status: AC
  Filled 2019-08-28: qty 1

## 2019-08-28 MED ORDER — ONDANSETRON HCL 4 MG/2ML IJ SOLN
INTRAMUSCULAR | Status: DC | PRN
Start: 2019-08-28 — End: 2019-08-28
  Administered 2019-08-28: 4 mg via INTRAVENOUS

## 2019-08-28 MED ORDER — BUPIVACAINE HCL (PF) 0.25 % IJ SOLN
INTRAMUSCULAR | Status: DC | PRN
Start: 2019-08-28 — End: 2019-08-28
  Administered 2019-08-28: 8 mL

## 2019-08-28 MED ORDER — FENTANYL CITRATE (PF) 100 MCG/2ML IJ SOLN
INTRAMUSCULAR | Status: AC
  Filled 2019-08-28: qty 2

## 2019-08-28 MED ORDER — EPHEDRINE 5 MG/ML INJ
INTRAVENOUS | Status: AC
  Filled 2019-08-28: qty 10

## 2019-08-28 MED ORDER — ARTIFICIAL TEARS OPHTHALMIC OINT
TOPICAL_OINTMENT | OPHTHALMIC | Status: AC
  Filled 2019-08-28: qty 3.5

## 2019-08-28 MED ORDER — GLYCOPYRROLATE PF 0.2 MG/ML IJ SOSY
PREFILLED_SYRINGE | INTRAMUSCULAR | Status: AC
  Filled 2019-08-28: qty 1

## 2019-08-28 MED ORDER — GLYCOPYRROLATE 0.2 MG/ML IJ SOLN
INTRAMUSCULAR | Status: DC | PRN
Start: 2019-08-28 — End: 2019-08-28
  Administered 2019-08-28: .1 mg via INTRAVENOUS

## 2019-08-28 MED ORDER — ONDANSETRON HCL 4 MG/2ML IJ SOLN
INTRAMUSCULAR | Status: AC
  Filled 2019-08-28: qty 2

## 2019-08-28 MED ORDER — ACETAMINOPHEN 325 MG PO TABS
ORAL_TABLET | ORAL | Status: AC
  Filled 2019-08-28: qty 2

## 2019-08-28 MED ORDER — LIDOCAINE 2% (20 MG/ML) 5 ML SYRINGE
INTRAMUSCULAR | Status: AC
  Filled 2019-08-28: qty 5

## 2019-08-28 SURGICAL SUPPLY — 52 items
APL SKNCLS STERI-STRIP NONHPOA (GAUZE/BANDAGES/DRESSINGS)
BENZOIN TINCTURE PRP APPL 2/3 (GAUZE/BANDAGES/DRESSINGS) IMPLANT
BLADE CLIPPER SURG (BLADE) IMPLANT
BLADE SURG 15 STRL LF DISP TIS (BLADE) ×1 IMPLANT
BLADE SURG 15 STRL SS (BLADE) ×2
BRUSH SCRUB EZ PLAIN DRY (MISCELLANEOUS) IMPLANT
BUR EGG ELITE 4.0 (BURR) ×1 IMPLANT
CLEANER CAUTERY TIP 5X5 PAD (MISCELLANEOUS) IMPLANT
COVER BACK TABLE 60X90IN (DRAPES) ×2 IMPLANT
COVER MAYO STAND STRL (DRAPES) ×2 IMPLANT
COVER WAND RF STERILE (DRAPES) IMPLANT
DRAPE U-SHAPE 76X120 STRL (DRAPES) ×2 IMPLANT
DRAPE UTILITY XL STRL (DRAPES) ×1 IMPLANT
DRSG EMULSION OIL 3X3 NADH (GAUZE/BANDAGES/DRESSINGS) ×1 IMPLANT
ELECT COATED BLADE 2.86 ST (ELECTRODE) ×1 IMPLANT
ELECT NDL BLADE 2-5/6 (NEEDLE) ×1 IMPLANT
ELECT NEEDLE BLADE 2-5/6 (NEEDLE) IMPLANT
ELECT REM PT RETURN 9FT ADLT (ELECTROSURGICAL) ×2
ELECTRODE REM PT RTRN 9FT ADLT (ELECTROSURGICAL) IMPLANT
GAUZE 4X4 16PLY RFD (DISPOSABLE) IMPLANT
GAUZE SPONGE 4X4 12PLY STRL LF (GAUZE/BANDAGES/DRESSINGS) IMPLANT
GAUZE XEROFORM 1X8 LF (GAUZE/BANDAGES/DRESSINGS) IMPLANT
GLOVE BIO SURGEON STRL SZ 6 (GLOVE) ×3 IMPLANT
GLOVE BIO SURGEON STRL SZ7 (GLOVE) ×1 IMPLANT
GLOVE BIOGEL PI IND STRL 6.5 (GLOVE) IMPLANT
GLOVE BIOGEL PI IND STRL 7.0 (GLOVE) IMPLANT
GLOVE BIOGEL PI INDICATOR 6.5 (GLOVE) ×1
GLOVE BIOGEL PI INDICATOR 7.0 (GLOVE) ×1
GLOVE EXAM NITRILE PF LG BLUE (GLOVE) IMPLANT
GOWN STRL REUS W/ TWL LRG LVL3 (GOWN DISPOSABLE) ×2 IMPLANT
GOWN STRL REUS W/TWL LRG LVL3 (GOWN DISPOSABLE) ×4
NDL HYPO 30GX1 BEV (NEEDLE) IMPLANT
NDL PRECISIONGLIDE 27X1.5 (NEEDLE) ×1 IMPLANT
NEEDLE HYPO 30GX1 BEV (NEEDLE) ×2 IMPLANT
NEEDLE PRECISIONGLIDE 27X1.5 (NEEDLE) ×2 IMPLANT
NS IRRIG 1000ML POUR BTL (IV SOLUTION) ×1 IMPLANT
PACK BASIN DAY SURGERY FS (CUSTOM PROCEDURE TRAY) ×2 IMPLANT
PAD CLEANER CAUTERY TIP 5X5 (MISCELLANEOUS)
PENCIL SMOKE EVACUATOR (MISCELLANEOUS) ×1 IMPLANT
SHEET MEDIUM DRAPE 40X70 STRL (DRAPES) ×1 IMPLANT
SPONGE GAUZE 2X2 8PLY STRL LF (GAUZE/BANDAGES/DRESSINGS) IMPLANT
STAPLER VISISTAT 35W (STAPLE) ×1 IMPLANT
STRIP CLOSURE SKIN 1/2X4 (GAUZE/BANDAGES/DRESSINGS) IMPLANT
SUT CHROMIC 4 0 P 3 18 (SUTURE) ×1 IMPLANT
SUT MNCRL AB 4-0 PS2 18 (SUTURE) ×1 IMPLANT
SUT MON AB 5-0 P3 18 (SUTURE) IMPLANT
SUT PLAIN 5 0 P 3 18 (SUTURE) ×1 IMPLANT
SUT VICRYL 4-0 PS2 18IN ABS (SUTURE) IMPLANT
SYR BULB EAR ULCER 3OZ GRN STR (SYRINGE) ×1 IMPLANT
SYR CONTROL 10ML LL (SYRINGE) ×2 IMPLANT
TRAY DSU PREP LF (CUSTOM PROCEDURE TRAY) ×2 IMPLANT
UNDERPAD 30X36 HEAVY ABSORB (UNDERPADS AND DIAPERS) ×1 IMPLANT

## 2019-08-28 NOTE — Anesthesia Procedure Notes (Signed)
Procedure Name: LMA Insertion Date/Time: 08/28/2019 7:35 AM Performed by: Collier Bullock, CRNA Pre-anesthesia Checklist: Patient identified, Emergency Drugs available, Suction available and Patient being monitored Patient Re-evaluated:Patient Re-evaluated prior to induction Oxygen Delivery Method: Circle system utilized Preoxygenation: Pre-oxygenation with 100% oxygen Induction Type: IV induction LMA: LMA inserted LMA Size: 4.0 Number of attempts: 1 Placement Confirmation: positive ETCO2 and breath sounds checked- equal and bilateral Tube secured with: Tape Dental Injury: Teeth and Oropharynx as per pre-operative assessment

## 2019-08-28 NOTE — Transfer of Care (Signed)
Immediate Anesthesia Transfer of Care Note  Patient: Cindy Harrison  Procedure(s) Performed: SKIN GRAFT FULL THICKNESS FROM LEFT UPPER ARM TO SCALP (N/A Scalp)  Patient Location: PACU  Anesthesia Type:General  Level of Consciousness: awake, alert , drowsy and patient cooperative  Airway & Oxygen Therapy: Patient Spontanous Breathing and Patient connected to face mask oxygen  Post-op Assessment: Report given to RN, Post -op Vital signs reviewed and stable, Patient moving all extremities X 4 and Patient able to stick tongue midline  Post vital signs: Reviewed and stable  Last Vitals:  Vitals Value Taken Time  BP 157/84 08/28/19 0850  Temp    Pulse 86 08/28/19 0853  Resp 17 08/28/19 0853  SpO2 100 % 08/28/19 0853  Vitals shown include unvalidated device data.  Last Pain:  Vitals:   08/28/19 0634  TempSrc: Oral  PainSc: 0-No pain         Complications: No complications documented.

## 2019-08-28 NOTE — Anesthesia Postprocedure Evaluation (Signed)
Anesthesia Post Note  Patient: Cindy Harrison  Procedure(s) Performed: SKIN GRAFT FULL THICKNESS FROM LEFT UPPER ARM TO SCALP (N/A Scalp)     Patient location during evaluation: PACU Anesthesia Type: General Level of consciousness: awake and alert Pain management: pain level controlled Vital Signs Assessment: post-procedure vital signs reviewed and stable Respiratory status: spontaneous breathing, nonlabored ventilation and respiratory function stable Cardiovascular status: blood pressure returned to baseline and stable Postop Assessment: no apparent nausea or vomiting Anesthetic complications: no   No complications documented.  Last Vitals:  Vitals:   08/28/19 0930 08/28/19 1038  BP: (!) 146/52 (!) 152/62  Pulse: 76 83  Resp: 18 18  Temp:  36.6 C  SpO2: 96%     Last Pain:  Vitals:   08/28/19 1038  TempSrc:   PainSc: [place] Sophina Mitten

## 2019-08-28 NOTE — Op Note (Signed)
Operative Note   DATE OF OPERATION: 8.10.21  LOCATION: [facility]-outpatient  SURGICAL DIVISION: Plastic Surgery  PREOPERATIVE DIAGNOSES:  1. Mohs defect scalp 2. Squamous cell carcinoma scalp  POSTOPERATIVE DIAGNOSES:  same  PROCEDURE:  1. Surgical preparation for grafting scalp 9cm2 2. Full thickness skin graft from left arm to scalp 9 cm2.  SURGEON: Irene Limbo MD MBA  ASSISTANT: none  ANESTHESIA:  General.   EBL: 15 ml  COMPLICATIONS: None immediate.   INDICATIONS FOR PROCEDURE:  The patient, Cindy Harrison, is a 86 y.o. female born on 1925-12-08, is here for coverage scalp defect following Mohs resection squamous cell carcinoma recurrent.   FINDINGS: Open wound scalp 3.5 x 3 cm with exposed bone devoid of periosteum over 3 x 2.5 cm area.  DESCRIPTION OF PROCEDURE:  The patient's operative site was marked with the patient in the preoperative area. The patient was taken to the operating room. SCDs were placed and IV antibiotics were given. The patient's operative site was prepped and draped in a sterile fashion. A time out was performed and all information was confirmed to be correct. Local anesthetic infiltrated surrounding wound and left arm donor site. Burr used to remove outer table exposed bone until punctate bleeding bone encountered. Slough over remaining open wound directly excised with scissors. Wound irrigated. Left arm medial elliptical skin excision designed. Full thickness skin sharply excised. Donor site closed with interrupted 4-0 monocryl in dermis and skin closure with 4-0 monocryl subcuticular. Dermabond applied.  Skin graft prepared by removing any subcutaneous fat. Graft inset over wound scalp with interrupted and short running 4-0 chromic and 5-0 plain gut. Graft pie crusted for drainage. Adaptic followed by dry dressing and sterile sponge applied as bolster.  The patient was allowed to wake from anesthesia, extubated and taken to the recovery room  in satisfactory condition.   SPECIMENS: none  DRAINS: none

## 2019-08-28 NOTE — Discharge Instructions (Signed)

## 2019-08-28 NOTE — Interval H&P Note (Signed)
History and Physical Interval Note:  08/28/2019 6:58 AM  Cindy Harrison  has presented today for surgery, with the diagnosis of mohs defect scalp squamous cell ca scalp.  The various methods of treatment have been discussed with the patient and family. After consideration of risks, benefits and other options for treatment, the patient has consented to  Procedure(s): SKIN GRAFT FULL THICKNESS TO SCALP (N/A) from left arm as a surgical intervention.  The patient's history has been reviewed, patient examined, no change in status, stable for surgery.  I have reviewed the patient's chart and labs.  Questions were answered to the patient's satisfaction.     Arnoldo Hooker Toni Hoffmeister

## 2019-08-29 ENCOUNTER — Encounter (HOSPITAL_BASED_OUTPATIENT_CLINIC_OR_DEPARTMENT_OTHER): Payer: Self-pay | Admitting: Plastic Surgery

## 2019-10-19 ENCOUNTER — Other Ambulatory Visit (HOSPITAL_COMMUNITY): Payer: Self-pay | Admitting: Family Medicine

## 2019-12-04 ENCOUNTER — Other Ambulatory Visit (HOSPITAL_COMMUNITY): Payer: Self-pay | Admitting: Family Medicine

## 2020-01-30 DIAGNOSIS — Z85828 Personal history of other malignant neoplasm of skin: Secondary | ICD-10-CM | POA: Diagnosis not present

## 2020-02-11 ENCOUNTER — Other Ambulatory Visit (HOSPITAL_COMMUNITY): Payer: Self-pay | Admitting: Family Medicine

## 2020-03-17 ENCOUNTER — Other Ambulatory Visit (HOSPITAL_COMMUNITY): Payer: Self-pay | Admitting: Family Medicine

## 2020-03-27 ENCOUNTER — Other Ambulatory Visit: Payer: Self-pay | Admitting: Physician Assistant

## 2020-03-27 DIAGNOSIS — M79605 Pain in left leg: Secondary | ICD-10-CM | POA: Diagnosis not present

## 2020-03-27 DIAGNOSIS — M7989 Other specified soft tissue disorders: Secondary | ICD-10-CM | POA: Diagnosis not present

## 2020-03-27 DIAGNOSIS — D692 Other nonthrombocytopenic purpura: Secondary | ICD-10-CM | POA: Diagnosis not present

## 2020-03-27 DIAGNOSIS — E1122 Type 2 diabetes mellitus with diabetic chronic kidney disease: Secondary | ICD-10-CM | POA: Diagnosis not present

## 2020-03-28 ENCOUNTER — Ambulatory Visit
Admission: RE | Admit: 2020-03-28 | Discharge: 2020-03-28 | Disposition: A | Discharge status: Home / Self Care | Payer: Medicare Other | Source: Ambulatory Visit | Attending: Physician Assistant | Admitting: Physician Assistant

## 2020-03-28 DIAGNOSIS — M7989 Other specified soft tissue disorders: Secondary | ICD-10-CM | POA: Diagnosis not present

## 2020-03-28 DIAGNOSIS — R600 Localized edema: Secondary | ICD-10-CM | POA: Diagnosis not present

## 2020-03-28 DIAGNOSIS — M79662 Pain in left lower leg: Secondary | ICD-10-CM | POA: Diagnosis not present

## 2020-04-09 DIAGNOSIS — R8290 Unspecified abnormal findings in urine: Secondary | ICD-10-CM | POA: Diagnosis not present

## 2020-04-09 DIAGNOSIS — M109 Gout, unspecified: Secondary | ICD-10-CM | POA: Diagnosis not present

## 2020-04-09 DIAGNOSIS — Z0000 Encounter for general adult medical examination without abnormal findings: Secondary | ICD-10-CM | POA: Diagnosis not present

## 2020-04-09 DIAGNOSIS — M810 Age-related osteoporosis without current pathological fracture: Secondary | ICD-10-CM | POA: Diagnosis not present

## 2020-04-09 DIAGNOSIS — E1122 Type 2 diabetes mellitus with diabetic chronic kidney disease: Secondary | ICD-10-CM | POA: Diagnosis not present

## 2020-04-09 DIAGNOSIS — E7800 Pure hypercholesterolemia, unspecified: Secondary | ICD-10-CM | POA: Diagnosis not present

## 2020-04-09 DIAGNOSIS — S0100XD Unspecified open wound of scalp, subsequent encounter: Secondary | ICD-10-CM | POA: Diagnosis not present

## 2020-04-09 DIAGNOSIS — R609 Edema, unspecified: Secondary | ICD-10-CM | POA: Diagnosis not present

## 2020-04-14 ENCOUNTER — Ambulatory Visit: Payer: Medicare Other | Admitting: Podiatry

## 2020-05-26 ENCOUNTER — Other Ambulatory Visit (HOSPITAL_COMMUNITY): Payer: Self-pay

## 2020-05-26 MED ORDER — TRIAMCINOLONE ACETONIDE 0.1 % EX CREA
TOPICAL_CREAM | CUTANEOUS | 1 refills | Status: DC
  Filled 2020-05-26: qty 120, 30d supply, fill #0

## 2020-06-02 DIAGNOSIS — Z85828 Personal history of other malignant neoplasm of skin: Secondary | ICD-10-CM | POA: Diagnosis not present

## 2020-07-28 ENCOUNTER — Other Ambulatory Visit (HOSPITAL_COMMUNITY): Payer: Self-pay

## 2020-07-28 MED FILL — Glipizide Tab ER 24HR 5 MG: ORAL | 90 days supply | Qty: 90 | Fill #0 | Status: AC

## 2020-08-04 DIAGNOSIS — C4442 Squamous cell carcinoma of skin of scalp and neck: Secondary | ICD-10-CM | POA: Diagnosis not present

## 2020-08-15 NOTE — Progress Notes (Signed)
Cindy Harrison Date of Birth: [birth year] Medical Record #[id number]  History of Present Illness: Cindy Harrison is seen for follow up CAD.  She had an anterior myocardial infarction in 2001 initially treated with thrombolytic therapy. She then underwent rotational atherectomy and stenting of a proximal LAD stenosis with a 3.0 x 18 mm Tetra stent.   Myoview study in March 2014 showed a fixed anterior apical defect without ischemia ejection fraction 62%.   She was seen in May 2020 and complained of chest pain. A Myoview study was done and showed a small area of infarct. No ischemia, normal EF 53%. Medical therapy was recommended.   On follow up today she is doing very well.  Remains very active. Denies any recurrent angina, dyspnea, or palpitations. She is going to start RT for a scalp cancer this week.   Current Outpatient Medications on File Prior to Visit  Medication Sig Dispense Refill   allopurinol (ZYLOPRIM) 100 MG tablet Take 100 mg by mouth daily.     aspirin EC 81 MG tablet Take 1 tablet (81 mg total) by mouth daily. 90 tablet 3   Blood Glucose Monitoring Suppl (ACCU-CHEK NANO SMARTVIEW) W/DEVICE KIT      Calcium Carb-Cholecalciferol (CALCIUM 1000 + D PO) Take by mouth.     GLIPIZIDE XL 5 MG 24 hr tablet Take 5 mg by mouth daily.     lisinopril-hydrochlorothiazide (PRINZIDE,ZESTORETIC) 20-25 MG tablet Take 1 tablet by mouth daily.     nitroGLYCERIN (NITROSTAT) 0.4 MG SL tablet DISSOLVE 1 TABLET UNDER THE TONGUE AS NEEDED FOR CHEST PAIN. 15 tablet 1   sitaGLIPtin (JANUVIA) 100 MG tablet TAKE 1/2 TABLET BY MOUTH ONCE A DAY 30 DAY(S) 30 tablet 11   triamcinolone cream (KENALOG) 0.1 % Apply on the skin twice daily to affected area. 120 g 1   atorvastatin (LIPITOR) 20 MG tablet TAKE ONE (1) TABLET BY MOUTH EVERY DAY AT 6:00PM IN THE EVENING 90 tablet 2   No current facility-administered medications on file prior to visit.    No Known Allergies  Past Medical History:  Diagnosis Date    CAD (coronary artery disease)    Cancer ([place]) 07/2019   SCC to scalp   Diabetes mellitus    Endometrial mass    History of acute anterior wall MI 2001   stent prox LAD 3.0x18 Tetra   HTN (hypertension)    Hyperlipidemia    Postmenopausal bleeding    Sinus bradycardia     Past Surgical History:  Procedure Laterality Date   APPENDECTOMY     CATARACT EXTRACTION     bilateral   DILATION AND CURETTAGE OF UTERUS     HYSTEROSCOPY     SKIN FULL THICKNESS GRAFT N/A 08/28/2019   Procedure: SKIN GRAFT FULL THICKNESS FROM LEFT UPPER ARM TO SCALP;  Surgeon: Irene Limbo, MD;  Location: [facility];  Service: Plastics;  Laterality: N/A;    Social History   Tobacco Use  Smoking Status Never  Smokeless Tobacco Never    Social History   Substance and Sexual Activity  Alcohol Use No    History reviewed. No pertinent family history.  Review of Systems: As noted in history of present illness. All other systems were reviewed and are negative.  Physical Exam: BP 118/62   Pulse 60   Resp 18   Ht '4\' 11"'  (1.499 m)   Wt 97 lb 12.8 oz (44.4 kg)   SpO2 98%   BMI 19.75 kg/m  GENERAL:  Well appearing, elderly WF in NAD HEENT:  PERRL, EOMI, sclera are clear. Oropharynx is clear. NECK:  No jugular venous distention, carotid upstroke brisk and symmetric, no bruits, no thyromegaly or adenopathy LUNGS:  Clear to auscultation bilaterally CHEST:  Unremarkable HEART:  RRR,  PMI not displaced or sustained,S1 and S2 within normal limits, no S3, no S4: no clicks, no rubs, no murmurs ABD:  Soft, nontender. BS +, no masses or bruits. No hepatomegaly, no splenomegaly EXT:  2 + pulses throughout, trace left ankle edema, no cyanosis no clubbing SKIN:  Warm and dry.  No rashes NEURO:  Alert and oriented x 3. Cranial nerves II through XII intact. PSYCH:  Cognitively intact    LABORATORY: Lab Results  Component Value Date   WBC 5.6 08/28/2015   HGB 10.4 (L) 08/28/2015   HCT  32.2 (L) 08/28/2015   PLT 272 08/28/2015   GLUCOSE 150 (H) 08/28/2015   ALT 17 10/28/2008   AST 25 10/28/2008   NA 136 08/28/2015   K 4.0 08/28/2015   CL 100 (L) 08/28/2015   CREATININE 1.50 (H) 08/28/2015   BUN 26 (H) 08/28/2015   CO2 27 08/28/2015   Labs reviewed from Dr. Alroy Dust on 03/31/16: A1c 7.6%, glucose 179 Creatinine 1.15. Other chemistries are normal. Chol- 134, trig-170, HDL-64, LDL 56.  Dated 04/01/17: A1c 6.8%. BUN 31, creatinine 1.36. Cholesterol 159, triglycerides 75, HDL 69, LDL 74. Other chemistries normal.  Dated 04/04/19: A1c 7%. Cholesterol 154, triglycerides 84, HDL 74, LDL 64. Creatinine 1.52. CMET otherwise normal. Dated 04/09/20: A1c 7.1%. cholesterol 161, triglycerides 111, HDL 71, LDL 71. Creatinine 1.51. CMET otherwise normal.  Ecg today shows NSR rate 60. Old septal infarct. Otherwise normal. I have personally reviewed and interpreted this study.  Myoview 06/08/18: Study Highlights  The left ventricular ejection fraction is mildly decreased (45-54%). Nuclear stress EF: 53%. There was no ST segment deviation noted during stress. Defect 1: There is a medium defect of moderate severity present in the apical anterior, apical septal and apex location. Findings consistent with prior myocardial infarction, unchanged from prior. This is a low risk study.    Assessment / Plan: 1. Coronary disease status post remote anterior myocardial infarction with stenting of the LAD in 2001. She is asymptomatic. Myoview study 5/20 showed a small apical scar without ischemia. Normal EF. No change from prior. We'll continue with aspirin, statin, and ACE inhibitor. Follow up in one year.  2. Hyperlipidemia. Well controlled.   3. Diabetes mellitus type 2. A1c 7.1%. Management per primary care.  4. Hypertension, controlled.

## 2020-08-25 ENCOUNTER — Encounter: Payer: Self-pay | Admitting: Cardiology

## 2020-08-25 ENCOUNTER — Ambulatory Visit: Payer: Medicare Other | Admitting: Cardiology

## 2020-08-25 ENCOUNTER — Other Ambulatory Visit: Payer: Self-pay

## 2020-08-25 VITALS — BP 118/62 | HR 60 | Resp 18 | Ht 59.0 in | Wt 97.8 lb

## 2020-08-25 DIAGNOSIS — I10 Essential (primary) hypertension: Secondary | ICD-10-CM

## 2020-08-25 DIAGNOSIS — I25118 Atherosclerotic heart disease of native coronary artery with other forms of angina pectoris: Secondary | ICD-10-CM

## 2020-08-25 DIAGNOSIS — E7800 Pure hypercholesterolemia, unspecified: Secondary | ICD-10-CM

## 2020-08-26 NOTE — Progress Notes (Signed)
Radiation Oncology         [phone] ________________________________  Initial Outpatient Consultation  Name: Cindy Harrison MRN: [id number]  Date: 08/27/2020  DOB: 1925/03/19  [id number], L.Marlou Sa, MD  Jarome Matin, MD   REFERRING PHYSICIAN: Jarome Matin, MD  DIAGNOSIS:    ICD-10-CM   1. SCC (squamous cell carcinoma), scalp/neck  C44.42     2. Squamous cell carcinoma of scalp  C44.42        Mohs defect of scalp, moderately differentiated squamous cell carcinoma of scalp vertex  Cancer Staging Squamous cell carcinoma of scalp Staging form: Cutaneous Carcinoma of the Head and Neck, AJCC 8th Edition - Clinical stage from 08/27/2020: Stage III (cT3, cN0, cM0) - Signed by Eppie Gibson, MD on 08/28/2020 Extraosseous extension: Absent  CHIEF COMPLAINT: Here to discuss management of skin cancer  HISTORY OF PRESENT ILLNESS::Cindy Harrison is a 87 y.o. female who presented with a history of recurrent squamous cell carcinoma of the scalp; treated with Mohs procedure on 08/20/19. SCC of the scalp was treated in 2017 with electrodesiccation and curettage, and again in April of 2021. Per the initial consult note with Dr. Iran Planas [facility]) on 08/21/19, patient was stated to have a chronic open wound to the scalp for 8 years on and off since prior Mohs to the area. Physical exam performed by Dr. Iran Planas at this consult revealed the open wound (post recent Mohs procedure) on the vertex of the scalp to measure 3.5 x 3 cm.  The patient proceeded to receive full thickness skin graft to open scalp mohs defect area on 08/28/19 by Dr. Iran Planas. The patient continued to follow up with Dr. Iran Planas and Dr. Ronnald Ramp [facility]) in the following months in regards to chronic scabbing of the affected area.   Most recently, the patient underwent mid-central scalp skin biopsy revealing: moderately differentiated squamous cell carcinoma. Microscopic description noted a  proliferation of atypical epithelial cells infiltrating the dermis; with a moderate degree of squamous differentiation. I have conferred directly with Dr. Jarome Matin who hopes that Cindy Harrison is a candidate for salvage RT.  Cindy Harrison presents with her son today - Cindy Harrison lives alone and still drives. Cindy Harrison has some short term memory difficulty per her son.   History of Blistering sunburns, if any: During her youth  SAFETY ISSUES: Prior radiation? No Pacemaker/ICD? No Possible current pregnancy? No--postmenopausal  Is the patient on methotrexate? No  Current Complaints / other details:  Patient has received the first 2 Pfizer vaccines   PREVIOUS RADIATION THERAPY: No  PAST MEDICAL HISTORY:  has a past medical history of CAD (coronary artery disease), Cancer ([place]) (07/2019), Diabetes mellitus, Endometrial mass, History of acute anterior wall MI (2001), HTN (hypertension), Hyperlipidemia, Postmenopausal bleeding, and Sinus bradycardia.    PAST SURGICAL HISTORY: Past Surgical History:  Procedure Laterality Date   APPENDECTOMY     CATARACT EXTRACTION     bilateral   DILATION AND CURETTAGE OF UTERUS     HYSTEROSCOPY     SKIN FULL THICKNESS GRAFT N/A 08/28/2019   Procedure: SKIN GRAFT FULL THICKNESS FROM LEFT UPPER ARM TO SCALP;  Surgeon: Irene Limbo, MD;  Location: [facility];  Service: Plastics;  Laterality: N/A;    FAMILY HISTORY: family history is not on file.  SOCIAL HISTORY:  reports that Cindy Harrison has never smoked. Cindy Harrison has never used smokeless tobacco. Cindy Harrison reports that Cindy Harrison does not drink alcohol and does not use drugs.  ALLERGIES: Patient has no known allergies.  MEDICATIONS:  Current Outpatient Medications  Medication Sig Dispense Refill   allopurinol (ZYLOPRIM) 100 MG tablet Take 100 mg by mouth daily.     aspirin EC 81 MG tablet Take 1 tablet (81 mg total) by mouth daily. 90 tablet 3   atorvastatin (LIPITOR) 20 MG tablet TAKE ONE (1) TABLET BY MOUTH EVERY DAY AT 6:00PM IN THE  EVENING 90 tablet 2   Blood Glucose Monitoring Suppl (ACCU-CHEK NANO SMARTVIEW) W/DEVICE KIT      Calcium Carb-Cholecalciferol (CALCIUM 1000 + D PO) Take by mouth.     GLIPIZIDE XL 5 MG 24 hr tablet Take 5 mg by mouth daily.     lisinopril-hydrochlorothiazide (PRINZIDE,ZESTORETIC) 20-25 MG tablet Take 1 tablet by mouth daily.     nitroGLYCERIN (NITROSTAT) 0.4 MG SL tablet DISSOLVE 1 TABLET UNDER THE TONGUE AS NEEDED FOR CHEST PAIN. 15 tablet 1   sitaGLIPtin (JANUVIA) 100 MG tablet TAKE 1/2 TABLET BY MOUTH ONCE A DAY 30 DAY(S) 30 tablet 11   triamcinolone cream (KENALOG) 0.1 % Apply on the skin twice daily to affected area. 120 g 1   No current facility-administered medications for this encounter.    REVIEW OF SYSTEMS:  Notable for that above.   PHYSICAL EXAM:  height is '4\' 11"'  (1.499 m) and weight is 97 lb 6 oz (44.2 kg). Her temporal temperature is 97.5 F (36.4 C) (abnormal). Her blood pressure is 186/65 (abnormal) and her pulse is 66. Her respiration is 18 and oxygen saturation is 98%.   General: Alert, in no acute distress  HEENT:   Extraocular movements are intact. No occipital adenopathy. Bleeding, raised, oozing lesion on scalp - see photo Neck: Neck is supple, no palpable cervical or supraclavicular lymphadenopathy. Heart: Regular in rate and rhythm with no murmurs, rubs, or gallops. Chest: Clear to auscultation bilaterally, with no rhonchi, wheezes, or rales.   Photo   ECOG = 2  0 - Asymptomatic (Fully active, able to carry on all predisease activities without restriction)  1 - Symptomatic but completely ambulatory (Restricted in physically strenuous activity but ambulatory and able to carry out work of a light or sedentary nature. For example, light housework, office work)  2 - Symptomatic, <50% in bed during the day (Ambulatory and capable of all self care but unable to carry out any work activities. Up and about more than 50% of waking hours)  3 - Symptomatic, >50% in  bed, but not bedbound (Capable of only limited self-care, confined to bed or chair 50% or more of waking hours)  4 - Bedbound (Completely disabled. Cannot carry on any self-care. Totally confined to bed or chair)  5 - Death   Eustace Pen MM, Creech RH, Tormey DC, et al. [phone]). "Toxicity and response criteria of the [facility] Group". [place] Oncol. 5 (6): 649-55   LABORATORY DATA:  Lab Results  Component Value Date   WBC 5.6 08/28/2015   HGB 10.4 (L) 08/28/2015   HCT 32.2 (L) 08/28/2015   MCV 90.4 08/28/2015   PLT 272 08/28/2015   CMP     Component Value Date/Time   NA 136 08/28/2015 1850   K 4.0 08/28/2015 1850   CL 100 (L) 08/28/2015 1850   CO2 27 08/28/2015 1850   GLUCOSE 150 (H) 08/28/2015 1850   BUN 26 (H) 08/28/2015 1850   CREATININE 1.50 (H) 08/28/2015 1850   CALCIUM 9.7 08/28/2015 1850   PROT 7.1 10/28/2008 1055   ALBUMIN 3.8 10/28/2008 1055   AST 25  10/28/2008 1055   ALT 17 10/28/2008 1055   ALKPHOS 48 10/28/2008 1055   BILITOT 0.9 10/28/2008 1055   GFRNONAA 30 (L) 08/28/2015 1850   GFRAA 35 (L) 08/28/2015 1850        RADIOGRAPHY: No results found.    IMPRESSION/PLAN:Today, I talked to the patient about the findings and work-up thus far.  We discussed the patient's diagnosis of recurrent squamous cell carcinoma of the scalp and general treatment for this, highlighting the role of radiotherapy in the management.  We discussed the available radiation techniques, and focused on the details of logistics and delivery.    I think salvage RT for 4 weeks, 50Gy/20 fx, is a good option for her.  We discussed the risks, benefits, and side effects of radiotherapy. Side effects may include but not necessarily be limited to: skin irritation, bleeding, scabbing, permanent hair loss, fatigue, itching of scalp, rare injury to brain//skull. No guarantees of treatment were given. A consent form was signed and placed in the patient's medical record. The patient was  encouraged to ask questions that I answered to the best of my ability.  Cindy Harrison and her son are enthusiastic to proceed and we will schedule her soon for RT planning with electrons.   On date of service, in total, I spent 45 minutes on this encounter. Patient was seen in person.   __________________________________________   Eppie Gibson, MD  This document serves as a record of services personally performed by Eppie Gibson, MD. It was created on her behalf by Roney Mans, a trained medical scribe. The creation of this record is based on the scribe's personal observations and the provider's statements to them. This document has been checked and approved by the attending provider.

## 2020-08-27 ENCOUNTER — Other Ambulatory Visit: Payer: Self-pay

## 2020-08-27 ENCOUNTER — Encounter: Payer: Self-pay | Admitting: Radiation Oncology

## 2020-08-27 ENCOUNTER — Ambulatory Visit
Admission: RE | Admit: 2020-08-27 | Discharge: 2020-08-27 | Disposition: A | Discharge status: Home / Self Care | Payer: Medicare Other | Source: Ambulatory Visit | Attending: Radiation Oncology | Admitting: Radiation Oncology

## 2020-08-27 VITALS — BP 186/65 | HR 66 | Temp 97.5°F | Resp 18 | Ht 59.0 in | Wt 97.4 lb

## 2020-08-27 DIAGNOSIS — I2510 Atherosclerotic heart disease of native coronary artery without angina pectoris: Secondary | ICD-10-CM | POA: Diagnosis not present

## 2020-08-27 DIAGNOSIS — E119 Type 2 diabetes mellitus without complications: Secondary | ICD-10-CM | POA: Diagnosis not present

## 2020-08-27 DIAGNOSIS — I252 Old myocardial infarction: Secondary | ICD-10-CM | POA: Diagnosis not present

## 2020-08-27 DIAGNOSIS — I10 Essential (primary) hypertension: Secondary | ICD-10-CM | POA: Diagnosis not present

## 2020-08-27 DIAGNOSIS — C4442 Squamous cell carcinoma of skin of scalp and neck: Secondary | ICD-10-CM

## 2020-08-27 DIAGNOSIS — Z79899 Other long term (current) drug therapy: Secondary | ICD-10-CM | POA: Diagnosis not present

## 2020-08-27 DIAGNOSIS — E785 Hyperlipidemia, unspecified: Secondary | ICD-10-CM | POA: Insufficient documentation

## 2020-08-27 NOTE — Progress Notes (Signed)
Oncology Nurse Navigator Documentation   I met briefly with Ms. Holte and her son before they met with Dr. Isidore Moos today. I provided my card with my direct contact information and briefly discussed radiation therapy, CT simulation, and showed them a mask she may wear when receiving radiation. They thanked me for my time and know to call me if they have any questions or needs in the future.   Harlow Asa RN, BSN, OCN Head & Neck Oncology Nurse [facility] at [facility] Phone # [phone]  Fax # [phone]

## 2020-08-27 NOTE — Progress Notes (Signed)
Histology and Location of Primary Skin Cancer:    Cindy Harrison presented with the following signs/symptoms: lesion to the top of her scalp that has been monitored by her dermatologist for 8+ years  Past/Anticipated interventions by patient's surgeon/dermatologist for current problematic lesion, if any:  08/04/2020 Dr. Jarome Matin Shave biopsy to mid-central vertex scalp  Past skin cancers, if any:   History of Blistering sunburns, if any: During her youth  SAFETY ISSUES: Prior radiation? No Pacemaker/ICD? No Possible current pregnancy? No--postmenopausal  Is the patient on methotrexate? No  Current Complaints / other details:  Patient has received the fdirst 2 Pfizer vaccines

## 2020-08-28 DIAGNOSIS — C4442 Squamous cell carcinoma of skin of scalp and neck: Secondary | ICD-10-CM | POA: Insufficient documentation

## 2020-08-29 ENCOUNTER — Other Ambulatory Visit: Payer: Self-pay | Admitting: Radiation Oncology

## 2020-08-29 ENCOUNTER — Encounter: Payer: Self-pay | Admitting: General Practice

## 2020-08-29 ENCOUNTER — Other Ambulatory Visit: Payer: Self-pay

## 2020-08-29 ENCOUNTER — Ambulatory Visit
Admission: RE | Admit: 2020-08-29 | Discharge: 2020-08-29 | Disposition: A | Discharge status: Home / Self Care | Payer: Medicare Other | Source: Ambulatory Visit | Attending: Radiation Oncology | Admitting: Radiation Oncology

## 2020-08-29 DIAGNOSIS — C4442 Squamous cell carcinoma of skin of scalp and neck: Secondary | ICD-10-CM

## 2020-08-29 DIAGNOSIS — Z510 Encounter for antineoplastic radiation therapy: Secondary | ICD-10-CM | POA: Insufficient documentation

## 2020-08-29 NOTE — Progress Notes (Signed)
a 

## 2020-08-29 NOTE — Progress Notes (Signed)
[facility] CSW Progress Notes  Call to patient to address referral from Dr Isidore Moos - no answer, left VM w my contact information for patient and her son Fara Olden who accompanied her to appointment.  Encouraged them to call back, briefly described services available through [place].  Edwyna Shell, LCSW Clinical Social Worker Phone:  [phone]

## 2020-09-02 ENCOUNTER — Encounter: Payer: Self-pay | Admitting: General Practice

## 2020-09-02 NOTE — Progress Notes (Signed)
Cindy Harrison  Second call to patient and son to assess for needs/resources per referral from Cindy Harrison.  No answer, left VM for both parties.  Brief explanation of Support Center resources left on son's VM, with encouragement to call back if help is needed.  Cindy Shell, LCSW Clinical Social Worker Phone:  [phone]

## 2020-09-03 ENCOUNTER — Encounter: Payer: Self-pay | Admitting: General Practice

## 2020-09-03 DIAGNOSIS — Z510 Encounter for antineoplastic radiation therapy: Secondary | ICD-10-CM | POA: Diagnosis not present

## 2020-09-03 DIAGNOSIS — C4442 Squamous cell carcinoma of skin of scalp and neck: Secondary | ICD-10-CM | POA: Diagnosis not present

## 2020-09-03 NOTE — Progress Notes (Signed)
[facility] Clinical Social Work  Initial Assessment   Cindy Harrison is a 87 y.o. year old female contacted by phone. Clinical Social Work was referred by radiation oncologist for assessment of psychosocial needs.   SDOH (Social Determinants of Health) assessments performed: Yes SDOH Interventions    Flowsheet Row Most Recent Value  SDOH Interventions   Food Insecurity Interventions Intervention Not Indicated  Financial Strain Interventions Intervention Not Indicated  Housing Interventions Intervention Not Indicated  Stress Interventions Intervention Not Indicated  Social Connections Interventions Intervention Not Indicated  Transportation Interventions [facility]       Distress Screen completed: No No flowsheet data found.    Family/Social Information:  Housing Arrangement: patient lives alone, lives in apartment in [place] Family members/support persons in your life? Family and sons are supportive and involved Transportation concerns: yes, she hopes to drive herself to/from appointments, family has offered to help.  Also referred to [facility] so she has this resource as well  Employment: Retired. Income source: Paediatric nurse concerns: No Type of concern: None Food access concerns: no Religious or spiritual practice: yes, active member of local church Medication Concerns: no  Services Currently in place:  none  Coping/ Adjustment to diagnosis: Patient understands treatment plan and what happens next? yes, Newly diagnosed w scalp cancer, will undergo several weeks of radiation treatment.  Is somewhat anxious about this, but overall has understanding of her situation and planned treatment.   Concerns about diagnosis and/or treatment:  she has never had radiation treatment and is understandably nervous as she does not know what to expect Patient reported stressors:  none in particular Current coping skills/ strengths: Ability  for insight, Average or above average intelligence, Capable of independent living, Communication skills, Financial means, Religious Affiliation, and Supportive family/friends    SUMMARY: Current SDOH Barriers:  none  Interventions: Discussed common feeling and emotions when being diagnosed with cancer, and the importance of support during treatment Informed patient of the support team roles and support services at [facility] Provided CSW contact information and encouraged patient to call with any questions or concerns Referred patient to [employer], also provided brief overview of services available in [place]: Patient will contact CSW with any support or resource needs Patient verbalizes understanding of plan: No    Beverely Pace , [place], [place] Worker Phone:  [phone]

## 2020-09-05 ENCOUNTER — Telehealth: Payer: Self-pay | Admitting: Family Medicine

## 2020-09-05 NOTE — Telephone Encounter (Signed)
   Cindy Harrison DOB: December 16, 1925 MRN: [id number]   RIDER WAIVER AND RELEASE OF LIABILITY  For purposes of improving physical access to our facilities, [facility] is pleased to partner with third parties to provide [facility] patients or other authorized individuals the option of convenient, on-demand ground transportation services (the [place]") through use of the technology service that enables users to request on-demand ground transportation from independent third-party providers.  By opting to use and accept these [employer], I, the undersigned, hereby agree on behalf of myself, and on behalf of any minor child using the Government social research officer for whom I am the parent or legal guardian, as follows:  Government social research officer provided to me are provided by independent third-party transportation providers who are not [employer] or employees and who are unaffiliated with [employer]. [facility] is neither a transportation carrier nor a common or public carrier. [facility] has no control over the quality or safety of the transportation that occurs as a result of the [employer]. [facility] cannot guarantee that any third-party transportation provider will complete any arranged transportation service. [facility] makes no representation, warranty, or guarantee regarding the reliability, timeliness, quality, safety, suitability, or availability of any of the Transport Services or that they will be error free. I fully understand that traveling by vehicle involves risks and dangers of serious bodily injury, including permanent disability, paralysis, and death. I agree, on behalf of myself and on behalf of any minor child using the Transport Services for whom I am the parent or legal guardian, that the entire risk arising out of my use of the [employer] remains solely with me, to the maximum extent permitted under applicable law. The [employer] are provided "as  is" and "as available." [facility] disclaims all representations and warranties, express, implied or statutory, not expressly set out in these terms, including the implied warranties of merchantability and fitness for a particular purpose. I hereby waive and release [place], its agents, employees, officers, directors, representatives, insurers, attorneys, assigns, successors, subsidiaries, and affiliates from any and all past, present, or future claims, demands, liabilities, actions, causes of action, or suits of any kind directly or indirectly arising from acceptance and use of the [employer]. I further waive and release [place] and its affiliates from all present and future liability and responsibility for any injury or death to persons or damages to property caused by or related to the use of the [employer]. I have read this Waiver and Release of Liability, and I understand the terms used in it and their legal significance. This Waiver is freely and voluntarily given with the understanding that my right (as well as the right of any minor child for whom I am the parent or legal guardian using the [employer]) to legal recourse against [facility] [place] in connection with the [employer] is knowingly surrendered in return for use of these services.   I attest that I read the consent document to Christen Bame, gave Ms. Eisel the opportunity to ask questions and answered the questions asked (if any). I affirm that [employer] then provided consent for she's participation in this program.     Legrand Pitts, read to patient son and he gave consent on her behalf

## 2020-09-08 ENCOUNTER — Other Ambulatory Visit: Payer: Self-pay

## 2020-09-08 ENCOUNTER — Ambulatory Visit
Admission: RE | Admit: 2020-09-08 | Discharge: 2020-09-08 | Disposition: A | Discharge status: Home / Self Care | Payer: Medicare Other | Source: Ambulatory Visit | Attending: Radiation Oncology | Admitting: Radiation Oncology

## 2020-09-08 DIAGNOSIS — C4442 Squamous cell carcinoma of skin of scalp and neck: Secondary | ICD-10-CM | POA: Diagnosis not present

## 2020-09-08 DIAGNOSIS — Z510 Encounter for antineoplastic radiation therapy: Secondary | ICD-10-CM | POA: Diagnosis not present

## 2020-09-08 MED ORDER — SONAFINE EX EMUL
1.0000 "application " | Freq: Two times a day (BID) | CUTANEOUS | Status: DC
  Administered 2020-09-08: 1 via TOPICAL

## 2020-09-08 NOTE — Progress Notes (Signed)
Pt here for patient teaching.    Pt given Radiation and You booklet, skin care instructions, and Sonafine.    Reviewed areas of pertinence such as fatigue, hair loss, and skin changes .   Pt able to give teach back of to pat skin, use unscented/gentle soap, and drink plenty of water,apply Sonafine bid and avoid applying anything to skin within 4 hours of treatment.   Pt demonstrated understanding and verbalizes understanding of information given and will contact nursing with any questions or concerns.    [email or web address]/treatmentinformation/whattoexpect/index

## 2020-09-09 ENCOUNTER — Ambulatory Visit
Admission: RE | Admit: 2020-09-09 | Discharge: 2020-09-09 | Disposition: A | Discharge status: Home / Self Care | Payer: Medicare Other | Source: Ambulatory Visit | Attending: Radiation Oncology | Admitting: Radiation Oncology

## 2020-09-09 DIAGNOSIS — C4442 Squamous cell carcinoma of skin of scalp and neck: Secondary | ICD-10-CM | POA: Diagnosis not present

## 2020-09-09 DIAGNOSIS — Z510 Encounter for antineoplastic radiation therapy: Secondary | ICD-10-CM | POA: Diagnosis not present

## 2020-09-10 ENCOUNTER — Ambulatory Visit
Admission: RE | Admit: 2020-09-10 | Discharge: 2020-09-10 | Disposition: A | Discharge status: Home / Self Care | Payer: Medicare Other | Source: Ambulatory Visit | Attending: Radiation Oncology | Admitting: Radiation Oncology

## 2020-09-10 ENCOUNTER — Other Ambulatory Visit: Payer: Self-pay

## 2020-09-10 DIAGNOSIS — C4442 Squamous cell carcinoma of skin of scalp and neck: Secondary | ICD-10-CM | POA: Diagnosis not present

## 2020-09-10 DIAGNOSIS — Z510 Encounter for antineoplastic radiation therapy: Secondary | ICD-10-CM | POA: Diagnosis not present

## 2020-09-11 ENCOUNTER — Ambulatory Visit
Admission: RE | Admit: 2020-09-11 | Discharge: 2020-09-11 | Disposition: A | Discharge status: Home / Self Care | Payer: Medicare Other | Source: Ambulatory Visit | Attending: Radiation Oncology | Admitting: Radiation Oncology

## 2020-09-11 DIAGNOSIS — C4442 Squamous cell carcinoma of skin of scalp and neck: Secondary | ICD-10-CM | POA: Diagnosis not present

## 2020-09-11 DIAGNOSIS — Z510 Encounter for antineoplastic radiation therapy: Secondary | ICD-10-CM | POA: Diagnosis not present

## 2020-09-12 ENCOUNTER — Ambulatory Visit
Admission: RE | Admit: 2020-09-12 | Discharge: 2020-09-12 | Disposition: A | Discharge status: Home / Self Care | Payer: Medicare Other | Source: Ambulatory Visit | Attending: Radiation Oncology | Admitting: Radiation Oncology

## 2020-09-12 ENCOUNTER — Other Ambulatory Visit: Payer: Self-pay

## 2020-09-12 DIAGNOSIS — C4442 Squamous cell carcinoma of skin of scalp and neck: Secondary | ICD-10-CM | POA: Diagnosis not present

## 2020-09-12 DIAGNOSIS — Z510 Encounter for antineoplastic radiation therapy: Secondary | ICD-10-CM | POA: Diagnosis not present

## 2020-09-15 ENCOUNTER — Other Ambulatory Visit: Payer: Self-pay

## 2020-09-15 ENCOUNTER — Ambulatory Visit
Admission: RE | Admit: 2020-09-15 | Discharge: 2020-09-15 | Disposition: A | Discharge status: Home / Self Care | Payer: Medicare Other | Source: Ambulatory Visit | Attending: Radiation Oncology | Admitting: Radiation Oncology

## 2020-09-15 DIAGNOSIS — C4442 Squamous cell carcinoma of skin of scalp and neck: Secondary | ICD-10-CM | POA: Diagnosis not present

## 2020-09-15 DIAGNOSIS — Z510 Encounter for antineoplastic radiation therapy: Secondary | ICD-10-CM | POA: Diagnosis not present

## 2020-09-16 ENCOUNTER — Other Ambulatory Visit (HOSPITAL_COMMUNITY): Payer: Self-pay

## 2020-09-16 ENCOUNTER — Ambulatory Visit
Admission: RE | Admit: 2020-09-16 | Discharge: 2020-09-16 | Disposition: A | Discharge status: Home / Self Care | Payer: Medicare Other | Source: Ambulatory Visit | Attending: Radiation Oncology | Admitting: Radiation Oncology

## 2020-09-16 DIAGNOSIS — Z510 Encounter for antineoplastic radiation therapy: Secondary | ICD-10-CM | POA: Diagnosis not present

## 2020-09-16 DIAGNOSIS — C4442 Squamous cell carcinoma of skin of scalp and neck: Secondary | ICD-10-CM | POA: Diagnosis not present

## 2020-09-16 MED ORDER — LISINOPRIL-HYDROCHLOROTHIAZIDE 20-25 MG PO TABS
ORAL_TABLET | ORAL | 3 refills | Status: DC
  Filled 2020-09-16: qty 90, 90d supply, fill #0

## 2020-09-17 ENCOUNTER — Ambulatory Visit
Admission: RE | Admit: 2020-09-17 | Discharge: 2020-09-17 | Disposition: A | Discharge status: Home / Self Care | Payer: Medicare Other | Source: Ambulatory Visit | Attending: Radiation Oncology | Admitting: Radiation Oncology

## 2020-09-17 ENCOUNTER — Other Ambulatory Visit: Payer: Self-pay

## 2020-09-17 DIAGNOSIS — C4442 Squamous cell carcinoma of skin of scalp and neck: Secondary | ICD-10-CM | POA: Diagnosis not present

## 2020-09-17 DIAGNOSIS — Z510 Encounter for antineoplastic radiation therapy: Secondary | ICD-10-CM | POA: Diagnosis not present

## 2020-09-18 ENCOUNTER — Ambulatory Visit
Admission: RE | Admit: 2020-09-18 | Discharge: 2020-09-18 | Disposition: A | Discharge status: Home / Self Care | Payer: Medicare Other | Source: Ambulatory Visit | Attending: Radiation Oncology | Admitting: Radiation Oncology

## 2020-09-18 ENCOUNTER — Other Ambulatory Visit: Payer: Self-pay

## 2020-09-18 DIAGNOSIS — C4442 Squamous cell carcinoma of skin of scalp and neck: Secondary | ICD-10-CM | POA: Diagnosis not present

## 2020-09-19 ENCOUNTER — Other Ambulatory Visit (HOSPITAL_COMMUNITY): Payer: Self-pay

## 2020-09-19 ENCOUNTER — Ambulatory Visit
Admission: RE | Admit: 2020-09-19 | Discharge: 2020-09-19 | Disposition: A | Discharge status: Home / Self Care | Payer: Medicare Other | Source: Ambulatory Visit | Attending: Radiation Oncology | Admitting: Radiation Oncology

## 2020-09-19 DIAGNOSIS — C4442 Squamous cell carcinoma of skin of scalp and neck: Secondary | ICD-10-CM | POA: Diagnosis not present

## 2020-09-23 ENCOUNTER — Ambulatory Visit
Admission: RE | Admit: 2020-09-23 | Discharge: 2020-09-23 | Disposition: A | Discharge status: Home / Self Care | Payer: Medicare Other | Source: Ambulatory Visit | Attending: Radiation Oncology | Admitting: Radiation Oncology

## 2020-09-23 ENCOUNTER — Other Ambulatory Visit: Payer: Self-pay

## 2020-09-23 DIAGNOSIS — C4442 Squamous cell carcinoma of skin of scalp and neck: Secondary | ICD-10-CM | POA: Diagnosis not present

## 2020-09-24 ENCOUNTER — Ambulatory Visit
Admission: RE | Admit: 2020-09-24 | Discharge: 2020-09-24 | Disposition: A | Discharge status: Home / Self Care | Payer: Medicare Other | Source: Ambulatory Visit | Attending: Radiation Oncology | Admitting: Radiation Oncology

## 2020-09-24 DIAGNOSIS — C4442 Squamous cell carcinoma of skin of scalp and neck: Secondary | ICD-10-CM | POA: Diagnosis not present

## 2020-09-25 ENCOUNTER — Ambulatory Visit
Admission: RE | Admit: 2020-09-25 | Discharge: 2020-09-25 | Disposition: A | Discharge status: Home / Self Care | Payer: Medicare Other | Source: Ambulatory Visit | Attending: Radiation Oncology | Admitting: Radiation Oncology

## 2020-09-25 ENCOUNTER — Other Ambulatory Visit: Payer: Self-pay

## 2020-09-25 DIAGNOSIS — C4442 Squamous cell carcinoma of skin of scalp and neck: Secondary | ICD-10-CM | POA: Diagnosis not present

## 2020-09-26 ENCOUNTER — Ambulatory Visit
Admission: RE | Admit: 2020-09-26 | Discharge: 2020-09-26 | Disposition: A | Discharge status: Home / Self Care | Payer: Medicare Other | Source: Ambulatory Visit | Attending: Radiation Oncology | Admitting: Radiation Oncology

## 2020-09-26 DIAGNOSIS — C4442 Squamous cell carcinoma of skin of scalp and neck: Secondary | ICD-10-CM | POA: Diagnosis not present

## 2020-09-29 ENCOUNTER — Ambulatory Visit
Admission: RE | Admit: 2020-09-29 | Discharge: 2020-09-29 | Disposition: A | Discharge status: Home / Self Care | Payer: Medicare Other | Source: Ambulatory Visit | Attending: Radiation Oncology | Admitting: Radiation Oncology

## 2020-09-29 ENCOUNTER — Other Ambulatory Visit: Payer: Self-pay

## 2020-09-29 DIAGNOSIS — C4442 Squamous cell carcinoma of skin of scalp and neck: Secondary | ICD-10-CM | POA: Diagnosis not present

## 2020-09-30 ENCOUNTER — Ambulatory Visit
Admission: RE | Admit: 2020-09-30 | Discharge: 2020-09-30 | Disposition: A | Discharge status: Home / Self Care | Payer: Medicare Other | Source: Ambulatory Visit | Attending: Radiation Oncology | Admitting: Radiation Oncology

## 2020-09-30 DIAGNOSIS — C4442 Squamous cell carcinoma of skin of scalp and neck: Secondary | ICD-10-CM | POA: Diagnosis not present

## 2020-10-01 ENCOUNTER — Other Ambulatory Visit: Payer: Self-pay

## 2020-10-01 ENCOUNTER — Ambulatory Visit
Admission: RE | Admit: 2020-10-01 | Discharge: 2020-10-01 | Disposition: A | Discharge status: Home / Self Care | Payer: Medicare Other | Source: Ambulatory Visit | Attending: Radiation Oncology | Admitting: Radiation Oncology

## 2020-10-01 DIAGNOSIS — C4442 Squamous cell carcinoma of skin of scalp and neck: Secondary | ICD-10-CM | POA: Diagnosis not present

## 2020-10-02 ENCOUNTER — Ambulatory Visit
Admission: RE | Admit: 2020-10-02 | Discharge: 2020-10-02 | Disposition: A | Discharge status: Home / Self Care | Payer: Medicare Other | Source: Ambulatory Visit | Attending: Radiation Oncology | Admitting: Radiation Oncology

## 2020-10-02 DIAGNOSIS — C4442 Squamous cell carcinoma of skin of scalp and neck: Secondary | ICD-10-CM | POA: Diagnosis not present

## 2020-10-03 ENCOUNTER — Ambulatory Visit
Admission: RE | Admit: 2020-10-03 | Discharge: 2020-10-03 | Disposition: A | Discharge status: Home / Self Care | Payer: Medicare Other | Source: Ambulatory Visit | Attending: Radiation Oncology | Admitting: Radiation Oncology

## 2020-10-03 ENCOUNTER — Other Ambulatory Visit: Payer: Self-pay

## 2020-10-03 DIAGNOSIS — C4442 Squamous cell carcinoma of skin of scalp and neck: Secondary | ICD-10-CM | POA: Diagnosis not present

## 2020-10-06 ENCOUNTER — Ambulatory Visit
Admission: RE | Admit: 2020-10-06 | Discharge: 2020-10-06 | Disposition: A | Discharge status: Home / Self Care | Payer: Medicare Other | Source: Ambulatory Visit | Attending: Radiation Oncology | Admitting: Radiation Oncology

## 2020-10-06 ENCOUNTER — Ambulatory Visit: Payer: Medicare Other

## 2020-10-06 ENCOUNTER — Other Ambulatory Visit: Payer: Self-pay

## 2020-10-06 DIAGNOSIS — C4442 Squamous cell carcinoma of skin of scalp and neck: Secondary | ICD-10-CM

## 2020-10-06 MED ORDER — SONAFINE EX EMUL
1.0000 "application " | Freq: Two times a day (BID) | CUTANEOUS | Status: DC
  Administered 2020-10-06: 1 via TOPICAL

## 2020-10-07 ENCOUNTER — Ambulatory Visit
Admission: RE | Admit: 2020-10-07 | Discharge: 2020-10-07 | Disposition: A | Discharge status: Home / Self Care | Payer: Medicare Other | Source: Ambulatory Visit | Attending: Radiation Oncology | Admitting: Radiation Oncology

## 2020-10-07 DIAGNOSIS — C4442 Squamous cell carcinoma of skin of scalp and neck: Secondary | ICD-10-CM | POA: Diagnosis not present

## 2020-10-08 ENCOUNTER — Encounter: Payer: Self-pay | Admitting: General Practice

## 2020-10-08 ENCOUNTER — Encounter: Payer: Self-pay | Admitting: Radiation Oncology

## 2020-10-08 ENCOUNTER — Ambulatory Visit
Admission: RE | Admit: 2020-10-08 | Discharge: 2020-10-08 | Disposition: A | Discharge status: Home / Self Care | Payer: Medicare Other | Source: Ambulatory Visit | Attending: Radiation Oncology | Admitting: Radiation Oncology

## 2020-10-08 ENCOUNTER — Other Ambulatory Visit: Payer: Self-pay

## 2020-10-08 DIAGNOSIS — C4442 Squamous cell carcinoma of skin of scalp and neck: Secondary | ICD-10-CM | POA: Diagnosis not present

## 2020-10-08 NOTE — Progress Notes (Signed)
[facility] CSW Progress Notes  Call from patient, she is worried about call she got from # [phone].  CSW called #, answering message states that this is patient's insurance company.  CSW left VM for patient advising her to return call to her insurance company.    Edwyna Shell, LCSW Clinical Social Worker Phone:  [phone]

## 2020-10-21 DIAGNOSIS — E7800 Pure hypercholesterolemia, unspecified: Secondary | ICD-10-CM | POA: Diagnosis not present

## 2020-10-21 DIAGNOSIS — M810 Age-related osteoporosis without current pathological fracture: Secondary | ICD-10-CM | POA: Diagnosis not present

## 2020-10-21 DIAGNOSIS — M109 Gout, unspecified: Secondary | ICD-10-CM | POA: Diagnosis not present

## 2020-10-21 DIAGNOSIS — Z23 Encounter for immunization: Secondary | ICD-10-CM | POA: Diagnosis not present

## 2020-10-21 DIAGNOSIS — I10 Essential (primary) hypertension: Secondary | ICD-10-CM | POA: Diagnosis not present

## 2020-10-21 DIAGNOSIS — E1122 Type 2 diabetes mellitus with diabetic chronic kidney disease: Secondary | ICD-10-CM | POA: Diagnosis not present

## 2020-10-23 ENCOUNTER — Other Ambulatory Visit (HOSPITAL_COMMUNITY): Payer: Self-pay

## 2020-11-06 NOTE — Progress Notes (Signed)
Mrs. Caffey presents today for follow-up after completing radiation to her scalp on 10/08/2020  Fatigue: energy is normal Pain issues, if any: none Skin: not healed yet F/U with dermatology?: no Other notable issues, if any: none  Patient in for follow up states she still has knot on her head and she does not see much healing. Son has not seen it yet.

## 2020-11-07 ENCOUNTER — Ambulatory Visit
Admission: RE | Admit: 2020-11-07 | Discharge: 2020-11-07 | Disposition: A | Discharge status: Home / Self Care | Payer: Medicare Other | Source: Ambulatory Visit | Attending: Radiation Oncology | Admitting: Radiation Oncology

## 2020-11-07 ENCOUNTER — Other Ambulatory Visit: Payer: Self-pay

## 2020-11-07 ENCOUNTER — Encounter: Payer: Self-pay | Admitting: Radiation Oncology

## 2020-11-07 VITALS — BP 163/55 | Ht 60.0 in | Wt 98.6 lb

## 2020-11-07 DIAGNOSIS — Z7982 Long term (current) use of aspirin: Secondary | ICD-10-CM | POA: Insufficient documentation

## 2020-11-07 DIAGNOSIS — Z79899 Other long term (current) drug therapy: Secondary | ICD-10-CM | POA: Diagnosis not present

## 2020-11-07 DIAGNOSIS — C4442 Squamous cell carcinoma of skin of scalp and neck: Secondary | ICD-10-CM | POA: Diagnosis not present

## 2020-11-07 DIAGNOSIS — Z7984 Long term (current) use of oral hypoglycemic drugs: Secondary | ICD-10-CM | POA: Diagnosis not present

## 2020-11-10 ENCOUNTER — Telehealth: Payer: Self-pay | Admitting: *Deleted

## 2020-11-10 NOTE — Progress Notes (Signed)
Radiation Oncology         [phone] ________________________________  Name: Cindy Harrison MRN: [id number]  Date: 11/07/2020  DOB: 1925/04/03  Follow-Up Visit Note  Outpatient  CC: Cindy Harrison, L.Marlou Sa, MD  Jarome Matin, MD  Diagnosis and Prior Radiotherapy:    ICD-10-CM   1. Squamous cell carcinoma of scalp  C44.42       CHIEF COMPLAINT: Here for follow-up and surveillance of scalp cancer  Narrative:  The patient returns today for routine follow-up.  Cindy Harrison presents today for follow-up after completing radiation to her scalp on 10/08/2020.  She received 55 Gray in 22 fractions completed on 10/09/2018.  Initially she was going to receive 53 Pearline Cables but I added on 2 extra fractions due to concern re: slow clinical response to radiation.  Fatigue: energy is normal Pain issues, if any: none Skin: not healed yet F/U with dermatology?: no Other notable issues, if any: none  Patient in for follow up states she still has knot on her head and she does not see much healing. Son has not seen it yet.                               ALLERGIES:  has No Known Allergies.  Meds: Current Outpatient Medications  Medication Sig Dispense Refill   allopurinol (ZYLOPRIM) 100 MG tablet Take 100 mg by mouth daily.     aspirin EC 81 MG tablet Take 1 tablet (81 mg total) by mouth daily. 90 tablet 3   Blood Glucose Monitoring Suppl (ACCU-CHEK NANO SMARTVIEW) W/DEVICE KIT      Calcium Carb-Cholecalciferol (CALCIUM 1000 + D PO) Take by mouth.     glipiZIDE (GLUCOTROL XL) 5 MG 24 hr tablet TAKE 1 TABLET BY MOUTH ONCE A DAY WITH BREAKFAST FOR DIABETES 90 90 tablet 3   lisinopril-hydrochlorothiazide (PRINZIDE,ZESTORETIC) 20-25 MG tablet Take 1 tablet by mouth daily.     nitroGLYCERIN (NITROSTAT) 0.4 MG SL tablet DISSOLVE 1 TABLET UNDER THE TONGUE AS NEEDED FOR CHEST PAIN. 15 tablet 1   sitaGLIPtin (JANUVIA) 100 MG tablet TAKE 1/2 TABLET BY MOUTH ONCE A DAY 30 DAY(S) 30 tablet 11   atorvastatin  (LIPITOR) 20 MG tablet TAKE ONE (1) TABLET BY MOUTH EVERY DAY AT 6:00PM IN THE EVENING 90 tablet 2   No current facility-administered medications for this encounter.    Physical Findings: The patient is in no acute distress. Patient is alert and oriented.  height is 5' (1.524 m) and weight is 98 lb 9.6 oz (44.7 kg). Her blood pressure is 163/55 (abnormal). .     Physical exam is notable for a persistent nodular lesion anteriorly at the scalp lesion and the borders of the tumor bed are somewhat raised, also concerning for residual disease.  There is necrotic tissue centrally in the treatment field.  Surrounding alopecia.   PRE-RT PHOTO    PHOTO TODAY (patient is facing in opposite direction)   Lab Findings: Lab Results  Component Value Date   WBC 5.6 08/28/2015   HGB 10.4 (L) 08/28/2015   HCT 32.2 (L) 08/28/2015   MCV 90.4 08/28/2015   PLT 272 08/28/2015    Radiographic Findings: No results found.  Impression/Plan:   I had a discussion with the patient and her son.  I am concerned for refractory/residual disease status post 4.5 weeks of radiation therapy.  I have contacted her dermatologist Jarome Matin who will be working the patient into  his schedule in the near future.  I have asked him to call me when he sees the patient in person.  It is possible that this lesion will continue to regress and he and I can discuss whether it makes sense for him to biopsy this or follow it closely.  I will also arrange for follow-up with myself and a consultation with medical oncology at the end of November in case she needs to discuss other salvage options such as systemic therapy.  Patient and her son appreciated this and they know to continue applying Vaseline to her bandage before she places it on her scalp and continue to use sonafine where she has alopecia.  On date of service, in total, I spent 45 minutes on this encounter. Patient was seen in person.   _____________________________________   Eppie Gibson, MD

## 2020-11-10 NOTE — Telephone Encounter (Signed)
Patient to have an appt. with Dr. Jarome Matin on 11-17-20 @ 2:20 pm, patient is aware of this appt.

## 2020-11-13 ENCOUNTER — Other Ambulatory Visit: Payer: Self-pay

## 2020-11-13 DIAGNOSIS — C4442 Squamous cell carcinoma of skin of scalp and neck: Secondary | ICD-10-CM

## 2020-11-17 DIAGNOSIS — C4442 Squamous cell carcinoma of skin of scalp and neck: Secondary | ICD-10-CM | POA: Diagnosis not present

## 2020-11-19 NOTE — Progress Notes (Signed)
                                                                                                                                                             Patient Name: Cindy Harrison MRN: [id number] DOB: [birth year] Referring Physician: Jarome Matin (Profile Not Attached) Date of Service: 10/08/2020 [facility] [place]                                                        End Of Treatment Note  Diagnoses: C44.42-Squamous cell carcinoma of skin of scalp and neck  Cancer Staging: Cancer Staging Squamous cell carcinoma of scalp Staging form: Cutaneous Carcinoma of the Head and Neck, AJCC 8th Edition - Clinical stage from 08/27/2020: Stage III (cT3, cN0, cM0) - Signed by Eppie Gibson, MD on 08/28/2020 Extraosseous extension: Absent  Intent: Curative  Radiation Treatment Dates: 09/08/2020 through 10/08/2020 Site Technique Total Dose (Gy) Dose per Fx (Gy) Completed Fx Beam Energies  Scalp: HN_scalp specialPort 55/55 2.5 22/22 9E   Narrative: The patient tolerated radiation therapy relatively well.  Dose was escalated from 50Gy to 55Gy due to persistent tumor visible towards end of treatment regimen.  Plan: The patient will follow-up with radiation oncology in 1 mo, sooner PRN . -----------------------------------  Eppie Gibson, MD

## 2020-12-01 DIAGNOSIS — C4442 Squamous cell carcinoma of skin of scalp and neck: Secondary | ICD-10-CM | POA: Diagnosis not present

## 2020-12-01 DIAGNOSIS — D485 Neoplasm of uncertain behavior of skin: Secondary | ICD-10-CM | POA: Diagnosis not present

## 2020-12-08 DIAGNOSIS — E1122 Type 2 diabetes mellitus with diabetic chronic kidney disease: Secondary | ICD-10-CM | POA: Diagnosis not present

## 2020-12-17 ENCOUNTER — Ambulatory Visit: Payer: Self-pay | Admitting: Radiation Oncology

## 2020-12-23 ENCOUNTER — Inpatient Hospital Stay: Payer: Medicare Other | Attending: Oncology | Admitting: Oncology

## 2020-12-23 ENCOUNTER — Ambulatory Visit
Admission: RE | Admit: 2020-12-23 | Discharge: 2020-12-23 | Disposition: A | Discharge status: Home / Self Care | Payer: Medicare Other | Source: Ambulatory Visit | Attending: Radiation Oncology | Admitting: Radiation Oncology

## 2020-12-23 ENCOUNTER — Other Ambulatory Visit: Payer: Self-pay

## 2020-12-23 VITALS — BP 167/68 | HR 68 | Temp 97.3°F | Resp 18 | Ht 60.0 in | Wt 98.4 lb

## 2020-12-23 VITALS — BP 174/67 | HR 73 | Temp 97.7°F | Resp 17 | Wt 97.3 lb

## 2020-12-23 DIAGNOSIS — C4442 Squamous cell carcinoma of skin of scalp and neck: Secondary | ICD-10-CM | POA: Insufficient documentation

## 2020-12-23 DIAGNOSIS — I119 Hypertensive heart disease without heart failure: Secondary | ICD-10-CM | POA: Diagnosis not present

## 2020-12-23 DIAGNOSIS — I2510 Atherosclerotic heart disease of native coronary artery without angina pectoris: Secondary | ICD-10-CM | POA: Diagnosis not present

## 2020-12-23 NOTE — Progress Notes (Signed)
Radiation Oncology         [phone] ________________________________  Name: CASIDEE JANN MRN: [id number]  Date: 12/23/2020  DOB: Nov 29, 1925  Follow-Up Visit Note  Outpatient  CC: Alroy Dust, L.Marlou Sa, MD  Jarome Matin, MD  Diagnosis and Prior Radiotherapy:    ICD-10-CM   1. Squamous cell carcinoma of scalp  C44.42       Cancer Staging  Squamous cell carcinoma of scalp Staging form: Cutaneous Carcinoma of the Head and Neck, AJCC 8th Edition - Clinical stage from 08/27/2020: Stage III (cT3, cN0, cM0) - Signed by Eppie Gibson, MD on 08/28/2020 Extraosseous extension: Absent   CHIEF COMPLAINT: Here for follow-up and surveillance of scalp cancer  Narrative:  Ms. Bilotta presents today for follow-up after completing radiation to her scalp on 10/08/2020  Skin: reports treatment area is occasionally tender (particularly around lesion). Reports area remains scabbed over, and will occasionally bleed if she bumps her head on something  Dermatology: Had follow-up Dr. Jarome Matin recently; son reports he performed a recent biopsy and left VM with results. I listened to this.  Other comments: Denies any headaches or vision changes. Reports she's sleeping well and has a stable appetite. Overall she states she's doing well. Scheduled for consultation with Dr. Alen Blew later this afternoon  12/01/20 photo of scalp from Dr. Ronnald Ramp:     Biopsy was done on 12/01/20; Pathology revealed:                                  ALLERGIES:  has No Known Allergies.  Meds: Current Outpatient Medications  Medication Sig Dispense Refill   allopurinol (ZYLOPRIM) 100 MG tablet Take 100 mg by mouth daily.     aspirin EC 81 MG tablet Take 1 tablet (81 mg total) by mouth daily. 90 tablet 3   atorvastatin (LIPITOR) 20 MG tablet TAKE ONE (1) TABLET BY MOUTH EVERY DAY AT 6:00PM IN THE EVENING 90 tablet 2   Blood Glucose Monitoring Suppl (ACCU-CHEK NANO SMARTVIEW) W/DEVICE KIT      Calcium  Carb-Cholecalciferol (CALCIUM 1000 + D PO) Take by mouth.     glipiZIDE (GLUCOTROL XL) 5 MG 24 hr tablet TAKE 1 TABLET BY MOUTH ONCE A DAY WITH BREAKFAST FOR DIABETES 90 90 tablet 3   lisinopril-hydrochlorothiazide (PRINZIDE,ZESTORETIC) 20-25 MG tablet Take 1 tablet by mouth daily.     nitroGLYCERIN (NITROSTAT) 0.4 MG SL tablet DISSOLVE 1 TABLET UNDER THE TONGUE AS NEEDED FOR CHEST PAIN. 15 tablet 1   sitaGLIPtin (JANUVIA) 100 MG tablet TAKE 1/2 TABLET BY MOUTH ONCE A DAY 30 DAY(S) 30 tablet 11   No current facility-administered medications for this encounter.    Physical Findings: The patient is in no acute distress. Patient is alert and oriented.  height is 5' (1.524 m) and weight is 98 lb 6 oz (44.6 kg). Her temporal temperature is 97.3 F (36.3 C) (abnormal). Her blood pressure is 167/68 (abnormal) and her pulse is 68. Her respiration is 18 and oxygen saturation is 100%. .     Physical exam is notable for a persistent nodular lesion anteriorly at the scalp lesion, still concerning for residual disease.  Size of nodule is similar. The necrotic tissue centrally in the treatment field is resolving but slow to keratinize.  Surrounding alopecia.   PRE-RT PHOTO    PHOTO from 11/07/20 (patient is facing in opposite direction)   Photos today - 12/23/20  Lab Findings: Lab Results  Component Value Date   WBC 5.6 08/28/2015   HGB 10.4 (L) 08/28/2015   HCT 32.2 (L) 08/28/2015   MCV 90.4 08/28/2015   PLT 272 08/28/2015    Radiographic Findings: No results found.  Impression/Plan:   I reached out to Dr. Ronnald Ramp about my impressions today and had a discussion with the patient and her son; I also called Dr. Alen Blew.  I think it is reasonable to follow with Dr. Alen Blew and hold systemic therapy to be used only if there is clear progression of disease. Dr. Alen Blew will see her today and discuss her options further. I will see her back on a PRN basis and I am happy to work her in as  needed. The lesion is healing slowly and the nodule is relatively stable, but serial observation will be valuable with Dr. Alen Blew in case salvage medication/immunotherapy is warranted.  On date of service, in total, I spent 40 minutes on this encounter. Patient was seen in person.  _____________________________________   Eppie Gibson, MD

## 2020-12-23 NOTE — Progress Notes (Signed)
Cindy Harrison presents today for follow-up after completing radiation to her scalp on 10/08/2020  Skin: reports treatment area is occasionally tender (particularly around lesion). Reports area remains scabbed over, and will occasionally bleed if she bumps her head on something  Dermatology: Had follow-up Dr. Jarome Matin recently; son reports he performed a recent biopsy and left VM with results.  Other comments: Denies any headaches or vision changes. Reports she's sleeping well and has a stable appetite. Overall she states she's doing well. Scheduled for consultation with Dr. Alen Blew later this afternoon

## 2020-12-23 NOTE — Progress Notes (Signed)
Reason for the request:   Squamous cell carcinoma of the skin  HPI: I was asked by Dr. Isidore Moos to evaluate Cindy Harrison for recurrent scalp malignancy.  She is a 87 year old woman with history of coronary artery disease, hypertension and hyperlipidemia with recurrent squamous cell carcinoma of the skin.  She was diagnosed in 2017 and it received local treatment in 2017 and in 2021.  She also underwent full-thickness skin graft in August 2021 under the care of Dr. Iran Planas.  She continues to follow with Dr. Ronnald Ramp and had a recurrent lesion in the mid central scalp area which showed moderately differentiated squamous cell carcinoma in August 2022. He received radiation to the scalp completed on October 08, 2020 after receiving 55 Gray in 22 fractions under the care of Dr. Isidore Moos.  Follow-up examination showed the majority of her tumor has regressed although she does have a an anterior nodule that is residual at this time.  Biopsy obtained by Dr. Ronnald Ramp showed nonspecific findings although could not rule out residual tumor.  Clinically, she reports no major complaints at this time.  The majority of history provided by her son currently.  She denies any pain, erythema or drainage.  She does not report any headaches, blurry vision, syncope or seizures. Does not report any fevers, chills or sweats.  Does not report any cough, wheezing or hemoptysis.  Does not report any chest pain, palpitation, orthopnea or leg edema.  Does not report any nausea, vomiting or abdominal pain.  Does not report any constipation or diarrhea.  Does not report any skeletal complaints.    Does not report frequency, urgency or hematuria.  Does not report any skin rashes or lesions. Does not report any heat or cold intolerance.  Does not report any lymphadenopathy or petechiae.  Does not report any anxiety or depression.  Remaining review of systems is negative.     Past Medical History:  Diagnosis Date   CAD (coronary artery  disease)    Cancer ([place]) 07/2019   SCC to scalp   Diabetes mellitus    Endometrial mass    History of acute anterior wall MI 2001   stent prox LAD 3.0x18 Tetra   HTN (hypertension)    Hyperlipidemia    Postmenopausal bleeding    Sinus bradycardia   :   Past Surgical History:  Procedure Laterality Date   APPENDECTOMY     CATARACT EXTRACTION     bilateral   DILATION AND CURETTAGE OF UTERUS     HYSTEROSCOPY     SKIN FULL THICKNESS GRAFT N/A 08/28/2019   Procedure: SKIN GRAFT FULL THICKNESS FROM LEFT UPPER ARM TO SCALP;  Surgeon: Irene Limbo, MD;  Location: [facility];  Service: Plastics;  Laterality: N/A;  :   Current Outpatient Medications:    allopurinol (ZYLOPRIM) 100 MG tablet, Take 100 mg by mouth daily., Disp: , Rfl:    aspirin EC 81 MG tablet, Take 1 tablet (81 mg total) by mouth daily., Disp: 90 tablet, Rfl: 3   atorvastatin (LIPITOR) 20 MG tablet, TAKE ONE (1) TABLET BY MOUTH EVERY DAY AT 6:00PM IN THE EVENING, Disp: 90 tablet, Rfl: 2   Blood Glucose Monitoring Suppl (ACCU-CHEK NANO SMARTVIEW) W/DEVICE KIT, , Disp: , Rfl:    Calcium Carb-Cholecalciferol (CALCIUM 1000 + D PO), Take by mouth., Disp: , Rfl:    glipiZIDE (GLUCOTROL XL) 5 MG 24 hr tablet, TAKE 1 TABLET BY MOUTH ONCE A DAY WITH BREAKFAST FOR DIABETES 90, Disp: 90 tablet,  Rfl: 3   lisinopril-hydrochlorothiazide (PRINZIDE,ZESTORETIC) 20-25 MG tablet, Take 1 tablet by mouth daily., Disp: , Rfl:    nitroGLYCERIN (NITROSTAT) 0.4 MG SL tablet, DISSOLVE 1 TABLET UNDER THE TONGUE AS NEEDED FOR CHEST PAIN., Disp: 15 tablet, Rfl: 1   sitaGLIPtin (JANUVIA) 100 MG tablet, TAKE 1/2 TABLET BY MOUTH ONCE A DAY 30 DAY(S), Disp: 30 tablet, Rfl: 11:  No Known Allergies:  No family history on file.:   Social History   Socioeconomic History   Marital status: Widowed    Spouse name: Not on file   Number of children: 3   Years of education: Not on file   Highest education level: Not on file   Occupational History   Not on file  Tobacco Use   Smoking status: Never   Smokeless tobacco: Never  Vaping Use   Vaping Use: Never used  Substance and Sexual Activity   Alcohol use: No   Drug use: No   Sexual activity: Not Currently    Birth control/protection: Post-menopausal  Other Topics Concern   Not on file  Social History Narrative   Not on file   Social Determinants of Health   Financial Resource Strain: Low Risk    Difficulty of Paying Living Expenses: Not hard at all  Food Insecurity: No Food Insecurity   Worried About Charity fundraiser in the Last Year: Never true   [place] in the Last Year: Never true  Transportation Needs: No Transportation Needs   Lack of Transportation (Medical): No   Lack of Transportation (Non-Medical): No  Physical Activity: Not on file  Stress: No Stress Concern Present   Feeling of Stress : Only a little  Social Connections: Moderately Integrated   Frequency of Communication with Friends and Family: More than three times a week   Frequency of Social Gatherings with Friends and Family: More than three times a week   Attends Religious Services: More than 4 times per year   Active Member of [employer] or Organizations: Yes   Attends Archivist Meetings: More than 4 times per year   Marital Status: Widowed  Intimate Partner Violence: Not on file  :  Pertinent items are noted in HPI.  Exam:  General appearance: alert and cooperative appeared without distress. Head: atraumatic without any abnormalities. Eyes: conjunctivae/corneas clear. PERRL.  Sclera anicteric. Throat: lips, mucosa, and tongue normal; without oral thrush or ulcers. Resp: clear to auscultation bilaterally without rhonchi, wheezes or dullness to percussion. Cardio: regular rate and rhythm, S1, S2 normal, no murmur, click, rub or gallop GI: soft, non-tender; bowel sounds normal; no masses,  no organomegaly Skin: Mild erythema and necrotic tissue noted on  the scalp.  A nodular mass noted anteriorly. Lymph nodes: Cervical, supraclavicular, and axillary nodes normal. Neurologic: Grossly normal without any motor, sensory or deep tendon reflexes. Musculoskeletal: No joint deformity or effusion.   Assessment and Plan:   87 year old woman with:  1.  Recurrent squamous cell carcinoma of the scalp diagnosed initially in 2017 and had multiple therapies.  Her most recent recurrent in August 2022 treated with surgical resection and radiation therapy.  She does have predominantly regressed lesion although anteriorly there is a questionable of residual disease.  The natural course of this disease was reviewed at this time and treatment options were discussed.  The role for systemic therapy at this time were discussed.  PD-1 inhibitor in the form of Libtayo was discussed.  This is approved for advanced squamous cell  carcinoma and can be used for locally advanced setting.  Risks and benefits of this drug were discussed.  Potential systemic complications including nausea, fatigue, dermatitis and autoimmune issues were reiterated.  Overall, although I think she would be a reasonable candidate for this drug if needed in the future I do not see any urgent need for it.  She has very little to no residual disease that could regress over a period of time and I doubt systemic therapy would help in this particular setting.  I have recommended instead monitoring this lesion closely and if it does not progress in the future to a more a locally advanced setting, systemic therapy could be considered.  She is agreeable with this plan and her questions as well as her son's questions were answered to her satisfaction.   2.  Follow-up: Will be in 3 months for repeat evaluation.  45  minutes were dedicated to this visit. The time was spent on reviewing pathology results, discussing treatment options, discussing differential diagnosis and answering questions regarding future  plan.     A copy of this consult has been forwarded to the requesting physician.

## 2021-01-06 ENCOUNTER — Other Ambulatory Visit (HOSPITAL_COMMUNITY): Payer: Self-pay

## 2021-02-13 ENCOUNTER — Telehealth: Payer: Self-pay | Admitting: Oncology

## 2021-02-13 NOTE — Telephone Encounter (Signed)
Called patient regarding upcoming appointments, called on both numbers and neither has voicemail set up. Calender will be mailed.

## 2021-03-24 ENCOUNTER — Ambulatory Visit: Payer: Medicare Other | Admitting: Oncology

## 2021-03-26 DIAGNOSIS — R413 Other amnesia: Secondary | ICD-10-CM | POA: Diagnosis not present

## 2021-03-31 ENCOUNTER — Telehealth: Payer: Self-pay | Admitting: Oncology

## 2021-03-31 NOTE — Telephone Encounter (Signed)
Called patient regarding 03/15 appointment, patient is notified. ?

## 2021-04-01 ENCOUNTER — Other Ambulatory Visit (HOSPITAL_COMMUNITY): Payer: Self-pay

## 2021-04-01 ENCOUNTER — Other Ambulatory Visit: Payer: Self-pay

## 2021-04-01 ENCOUNTER — Inpatient Hospital Stay: Payer: Medicare Other | Attending: Oncology | Admitting: Oncology

## 2021-04-01 ENCOUNTER — Other Ambulatory Visit: Payer: Self-pay | Admitting: Cardiology

## 2021-04-01 ENCOUNTER — Encounter: Payer: Self-pay | Admitting: *Deleted

## 2021-04-01 VITALS — BP 137/76 | HR 69 | Temp 97.9°F | Resp 15 | Ht 60.0 in | Wt 96.5 lb

## 2021-04-01 DIAGNOSIS — Z79899 Other long term (current) drug therapy: Secondary | ICD-10-CM | POA: Diagnosis not present

## 2021-04-01 DIAGNOSIS — Z923 Personal history of irradiation: Secondary | ICD-10-CM | POA: Insufficient documentation

## 2021-04-01 DIAGNOSIS — C4442 Squamous cell carcinoma of skin of scalp and neck: Secondary | ICD-10-CM | POA: Insufficient documentation

## 2021-04-01 NOTE — Progress Notes (Signed)
Hematology and Oncology Follow Up Visit ? ?Cindy Harrison ?[id number] ?May 12, 1925 88 y.o. ?04/01/2021 3:17 PM ?Alroy Dust, L.Dean, MDMitchell, L.Marlou Sa, MD  ? ?Principle Diagnosis: 88 year old woman with squamous cell carcinoma of the scalp diagnosed in 2017.  He had recurrent disease in 2022. ? ? ?Prior Therapy: ? ?She is status post full-thickness excision and skin graft in August 2021. ? ?He is status post radiation therapy the scalp completed on October 08, 2020 after receiving 55 Gray in 22 fractions under the care of Dr. Isidore Moos. ? ?Current therapy: Active surveillance ? ?Interim History: Cindy Harrison returns today for a follow-up visit.  Since last visit, she reports no major changes in her health.  She denies any new skin rashes or lesions.  She denies any constitutional symptoms or weight loss.  She denies any lymphadenopathy or excessive fatigue. ? ? ? ? ?Medications: I have reviewed the patient's current medications.  ?Current Outpatient Medications  ?Medication Sig Dispense Refill  ? allopurinol (ZYLOPRIM) 100 MG tablet Take 100 mg by mouth daily.    ? aspirin EC 81 MG tablet Take 1 tablet (81 mg total) by mouth daily. 90 tablet 3  ? atorvastatin (LIPITOR) 20 MG tablet TAKE ONE (1) TABLET BY MOUTH EVERY DAY AT 6:00PM IN THE EVENING 90 tablet 2  ? Blood Glucose Monitoring Suppl (ACCU-CHEK NANO SMARTVIEW) W/DEVICE KIT     ? Calcium Carb-Cholecalciferol (CALCIUM 1000 + D PO) Take by mouth.    ? glipiZIDE (GLUCOTROL XL) 5 MG 24 hr tablet TAKE 1 TABLET BY MOUTH ONCE A DAY WITH BREAKFAST FOR DIABETES 90 90 tablet 3  ? lisinopril-hydrochlorothiazide (PRINZIDE,ZESTORETIC) 20-25 MG tablet Take 1 tablet by mouth daily.    ? nitroGLYCERIN (NITROSTAT) 0.4 MG SL tablet DISSOLVE 1 TABLET UNDER THE TONGUE AS NEEDED FOR CHEST PAIN. 15 tablet 1  ? sitaGLIPtin (JANUVIA) 100 MG tablet TAKE 1/2 TABLET BY MOUTH ONCE A DAY 30 DAY(S) 30 tablet 11  ? ?No current facility-administered medications for this visit.  ? ? ? ?Allergies:   ?Allergies  ?Allergen Reactions  ? Glimepiride   ?  Other reaction(s): dizzy/sweats  ? Tramadol Hcl   ?  Other reaction(s): vomiting  ? ? ? ? ?Physical Exam: ? ?ECOG:  ? ? ?General appearance: Comfortable appearing without any discomfort ?Head: Normocephalic without any trauma ?Oropharynx: Mucous membranes are moist and pink without any thrush or ulcers. ?Eyes: Pupils are equal and round reactive to light. ?Lymph nodes: No cervical, supraclavicular, inguinal or axillary lymphadenopathy.   ?Heart:regular rate and rhythm.  S1 and S2 without leg edema. ?Lung: Clear without any rhonchi or wheezes.  No dullness to percussion. ?Abdomin: Soft, nontender, nondistended with good bowel sounds.  No hepatosplenomegaly. ?Musculoskeletal: No joint deformity or effusion.  Full range of motion noted. ?Neurological: No deficits noted on motor, sensory and deep tendon reflex exam. ?Skin: Nodular appearance of her scalp which is not dramatically changed. ? ? ? ?Lab Results: ?Lab Results  ?Component Value Date  ? WBC 5.6 08/28/2015  ? HGB 10.4 (L) 08/28/2015  ? HCT 32.2 (L) 08/28/2015  ? MCV 90.4 08/28/2015  ? PLT 272 08/28/2015  ? ?  Chemistry   ?   ?Component Value Date/Time  ? NA 136 08/28/2015 1850  ? K 4.0 08/28/2015 1850  ? CL 100 (L) 08/28/2015 1850  ? CO2 27 08/28/2015 1850  ? BUN 26 (H) 08/28/2015 1850  ? CREATININE 1.50 (H) 08/28/2015 1850  ?    ?Component Value Date/Time  ? CALCIUM 9.7  08/28/2015 1850  ? ALKPHOS 48 10/28/2008 1055  ? AST 25 10/28/2008 1055  ? ALT 17 10/28/2008 1055  ? BILITOT 0.9 10/28/2008 1055  ?  ? ? ? ? ? ? ?Impression and Plan: ? ? ?88 year old woman with: ? ?1.  Squamous cell carcinoma of the scalp diagnosed in 2017.  He developed recurrent disease in 2021 in 2022. ? ?Disease status was updated at this time and treatment choices were reviewed.  The role for systemic therapy utilizing Libtayo was reiterated at this time which will be deferred she developed locally advanced disease recurrent systemic  disease.  Her evaluation today does not reveal any major changes in her scalp lesion.  I see no evidence to suggest systemic disease or local regional disease.  At this time, I recommended continued active surveillance. ?  ?  ?2.  Follow-up: In 6 months for repeat follow-up. ?  ?30  minutes were spent on this encounter.  The time was dedicated to reviewing treatment choices, disease status update and complications related to cancer and cancer therapy. ?  ? ?Zola Button, MD ?3/15/20233:17 PM ? ?

## 2021-04-02 ENCOUNTER — Other Ambulatory Visit (HOSPITAL_COMMUNITY): Payer: Self-pay

## 2021-04-02 MED ORDER — ALLOPURINOL 100 MG PO TABS
ORAL_TABLET | ORAL | 1 refills | Status: AC
  Filled 2021-04-02: qty 90, 90d supply, fill #0

## 2021-04-02 MED ORDER — ATORVASTATIN CALCIUM 20 MG PO TABS
20.0000 mg | ORAL_TABLET | Freq: Every day | ORAL | 1 refills | Status: AC
  Filled 2021-04-02: qty 90, 90d supply, fill #0

## 2021-04-10 ENCOUNTER — Other Ambulatory Visit (HOSPITAL_COMMUNITY): Payer: Self-pay

## 2021-04-17 DIAGNOSIS — R413 Other amnesia: Secondary | ICD-10-CM | POA: Diagnosis not present

## 2021-04-17 DIAGNOSIS — E1122 Type 2 diabetes mellitus with diabetic chronic kidney disease: Secondary | ICD-10-CM | POA: Diagnosis not present

## 2021-04-17 DIAGNOSIS — I10 Essential (primary) hypertension: Secondary | ICD-10-CM | POA: Diagnosis not present

## 2021-04-17 DIAGNOSIS — E7800 Pure hypercholesterolemia, unspecified: Secondary | ICD-10-CM | POA: Diagnosis not present

## 2021-04-17 DIAGNOSIS — M810 Age-related osteoporosis without current pathological fracture: Secondary | ICD-10-CM | POA: Diagnosis not present

## 2021-04-17 DIAGNOSIS — N1832 Chronic kidney disease, stage 3b: Secondary | ICD-10-CM | POA: Diagnosis not present

## 2021-04-17 DIAGNOSIS — M109 Gout, unspecified: Secondary | ICD-10-CM | POA: Diagnosis not present

## 2021-04-17 DIAGNOSIS — Z0000 Encounter for general adult medical examination without abnormal findings: Secondary | ICD-10-CM | POA: Diagnosis not present

## 2021-04-24 ENCOUNTER — Other Ambulatory Visit (HOSPITAL_COMMUNITY): Payer: Self-pay

## 2021-05-03 IMAGING — US US EXTREM LOW VENOUS*L*
1 series · 13 of 24 positions shown · non-contrast
Comparison: None.

CLINICAL DATA: Left calf swelling for 1 week, associated pain



[Series 1: us extrem low venous*left* · 0.06mm/px · 45 acquisitions, 13 frames shown]
[im 1/45]
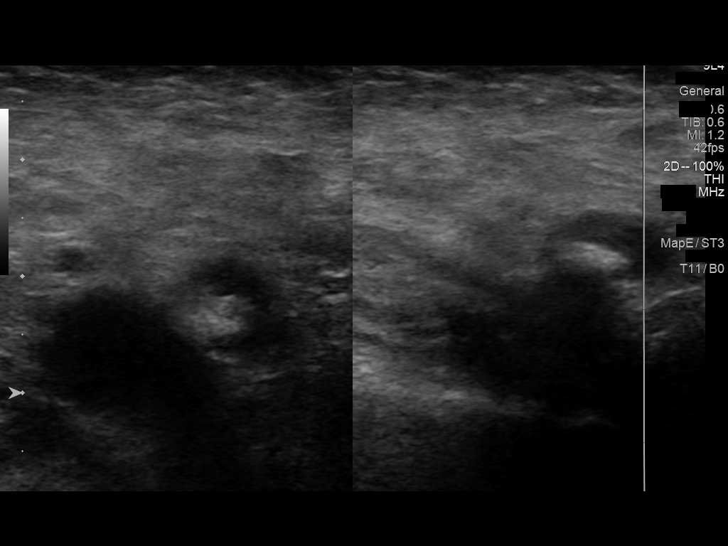
[im 4/45]
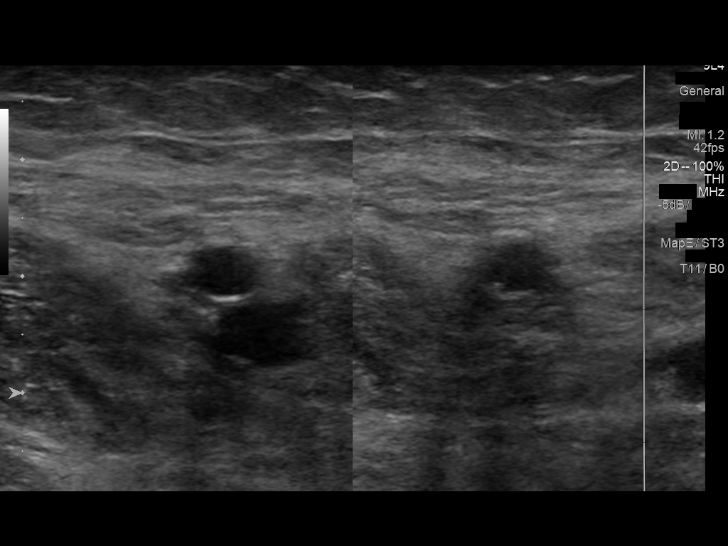
[im 8/45]
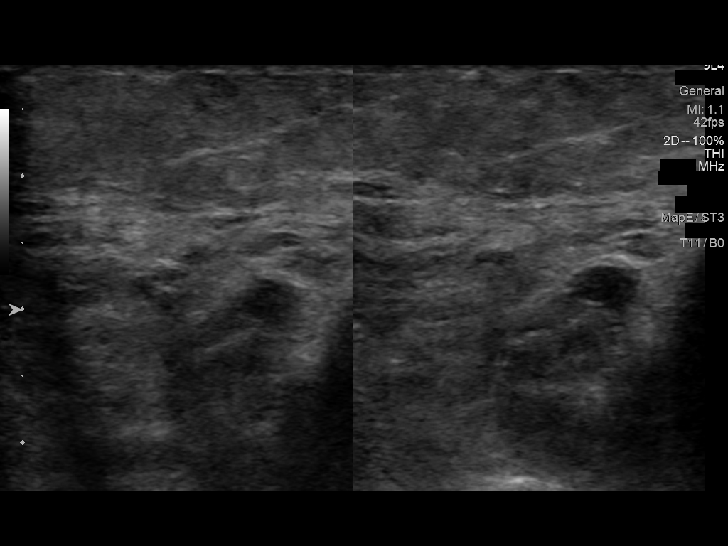
[im 14/45]
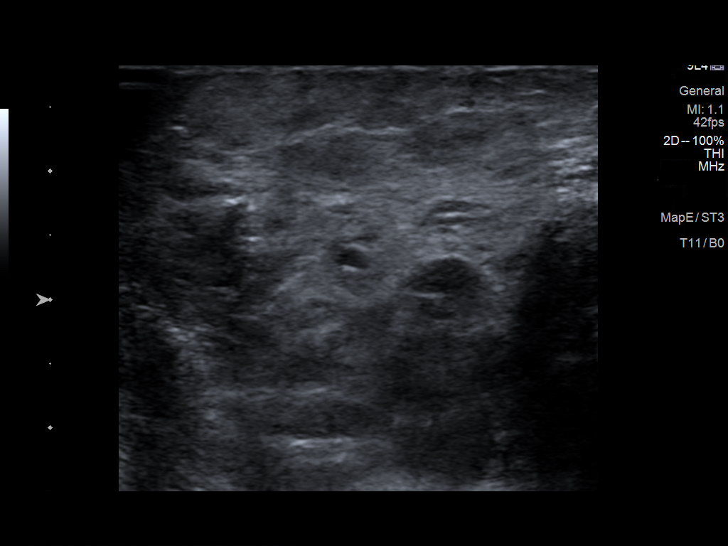
[im 16/45]
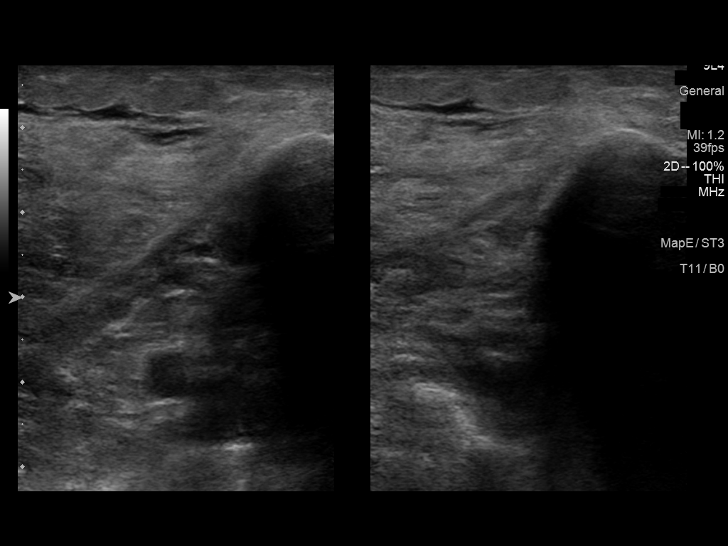
[im 20/45]
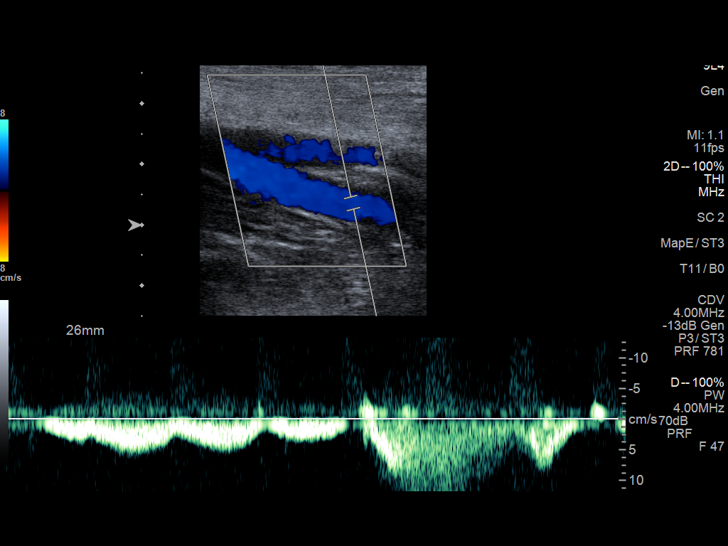
[im 23/45]
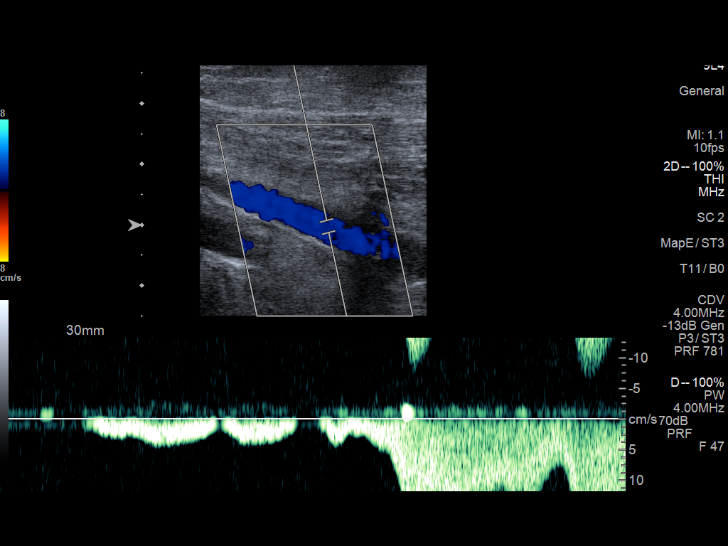
[im 25/45]
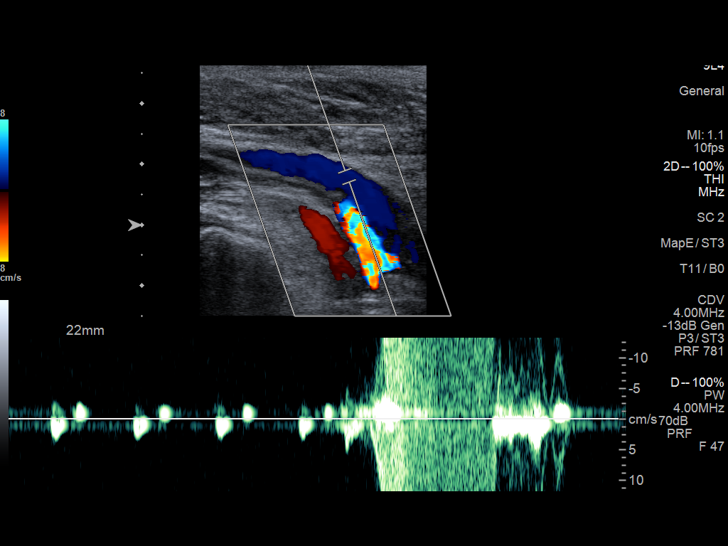
[im 29/45]
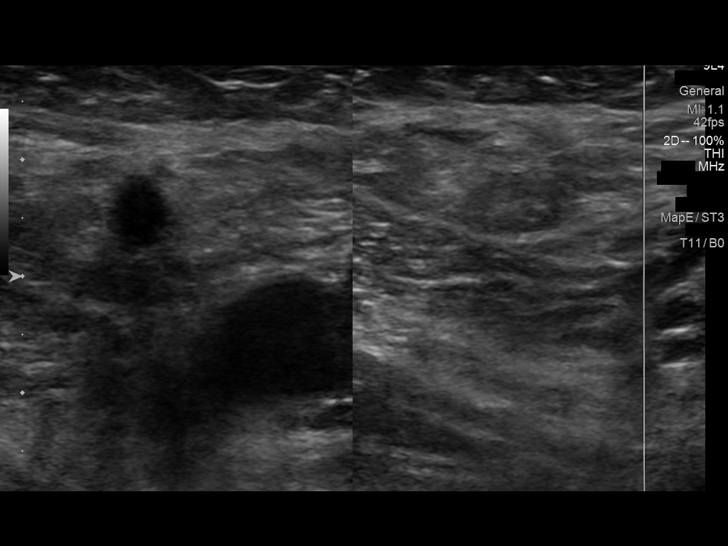
[im 33/45]
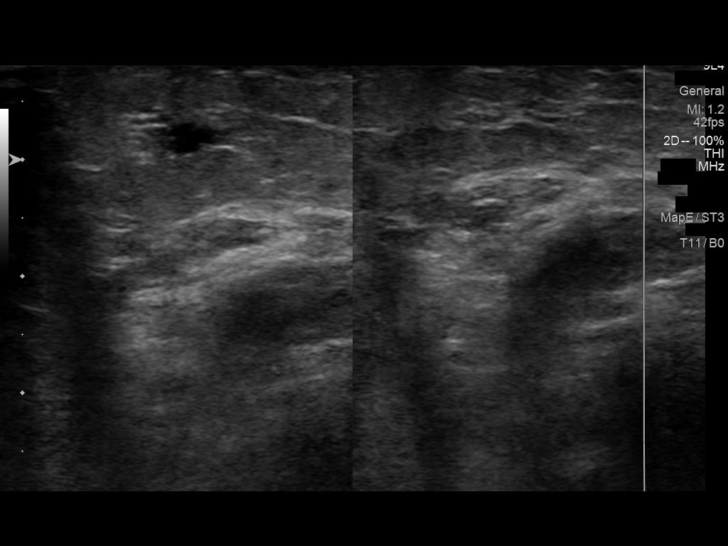
[im 39/45]
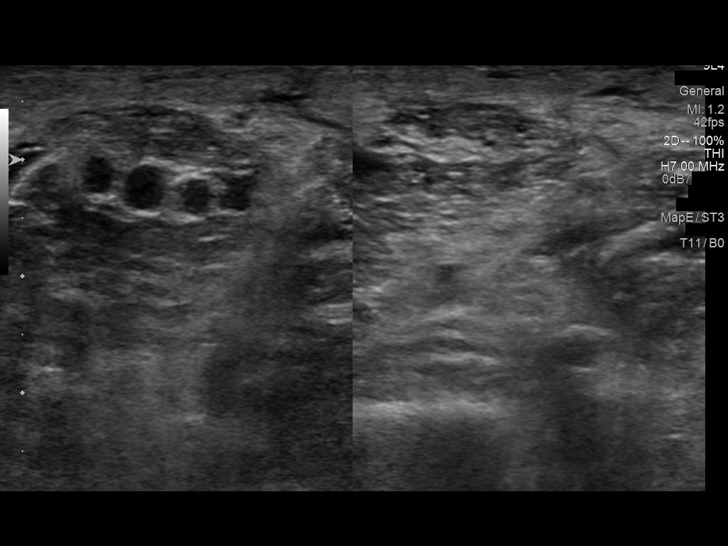
[im 43/45]
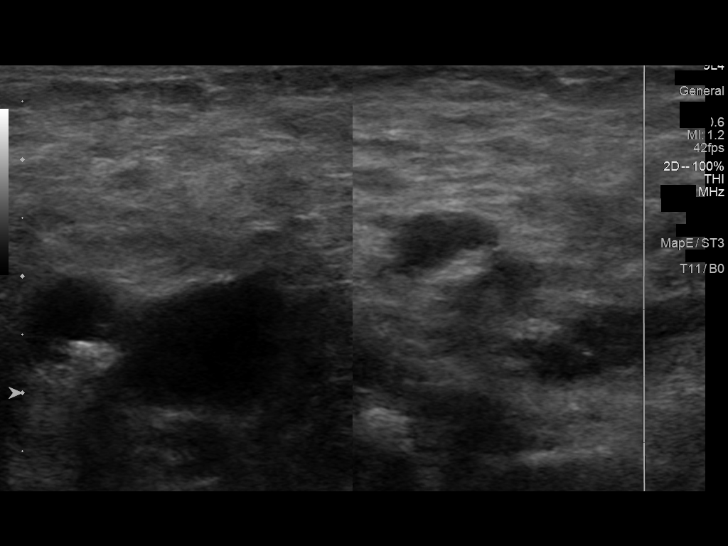
[im 45/45]
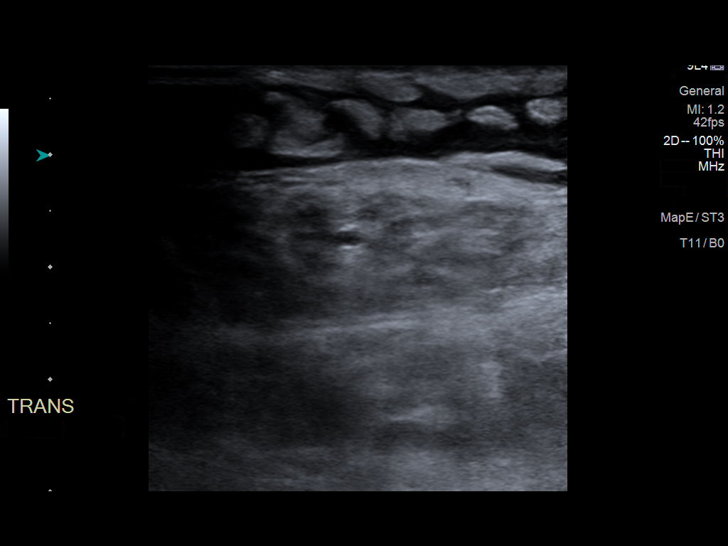

[13 of 24 positions shown; findings below may reference images not displayed]

FINDINGS: Contralateral Common Femoral Vein: Respiratory phasicity is normal
and symmetric with the symptomatic side. No evidence of thrombus.
Normal compressibility.

Common Femoral Vein: No evidence of thrombus. Normal
compressibility, respiratory phasicity and response to augmentation.

Saphenofemoral Junction: No evidence of thrombus. Normal
compressibility and flow on color Doppler imaging.

Profunda Femoral Vein: No evidence of thrombus. Normal
compressibility and flow on color Doppler imaging.

Femoral Vein: No evidence of thrombus. Normal compressibility,
respiratory phasicity and response to augmentation.

Popliteal Vein: No evidence of thrombus. Normal compressibility,
respiratory phasicity and response to augmentation.

Calf Veins: No evidence of thrombus. Normal compressibility and flow
on color Doppler imaging.

Other Findings: Left mid calf 9 mm round hypoechoic area adjacent
and posterior to the patent tibial veins remains nonspecific by soft
tissue ultrasound. Hypoechoic area is well circumscribed and
avascular. This may be related to recent injury or trauma.
Subcutaneous calf edema noted.
IMPRESSION: Negative for left lower extremity DVT.  See above comment.

## 2021-06-03 DIAGNOSIS — R404 Transient alteration of awareness: Secondary | ICD-10-CM | POA: Diagnosis not present

## 2021-06-03 DIAGNOSIS — Z743 Need for continuous supervision: Secondary | ICD-10-CM | POA: Diagnosis not present

## 2021-06-18 DIAGNOSIS — 419620001 Death: Secondary | SNOMED CT | POA: Diagnosis not present

## 2021-06-18 DEATH — deceased

## 2021-10-01 ENCOUNTER — Ambulatory Visit: Payer: Medicare Other | Admitting: Oncology
